# Patient Record
Sex: Male | Born: 1975 | Race: Black or African American | Hispanic: No | Marital: Single | Smoking: Current every day smoker
Health system: Southern US, Community
[De-identification: ages and names within clinical notes are randomized; demographics above are authoritative.]

## PROBLEM LIST (undated history)

## (undated) DIAGNOSIS — Z5900 Homelessness unspecified: Secondary | ICD-10-CM

## (undated) DIAGNOSIS — F1490 Cocaine use, unspecified, uncomplicated: Secondary | ICD-10-CM

## (undated) DIAGNOSIS — F1010 Alcohol abuse, uncomplicated: Secondary | ICD-10-CM

## (undated) DIAGNOSIS — S5292XA Unspecified fracture of left forearm, initial encounter for closed fracture: Secondary | ICD-10-CM

## (undated) DIAGNOSIS — F209 Schizophrenia, unspecified: Secondary | ICD-10-CM

---

## 2012-08-16 ENCOUNTER — Emergency Department (HOSPITAL_COMMUNITY)
Admission: EM | Admit: 2012-08-16 | Discharge: 2012-08-17 | Disposition: A | Discharge status: Home / Self Care | Payer: Self-pay | Attending: Emergency Medicine | Admitting: Emergency Medicine

## 2012-08-16 ENCOUNTER — Encounter (HOSPITAL_COMMUNITY): Payer: Self-pay | Admitting: *Deleted

## 2012-08-16 DIAGNOSIS — Y9389 Activity, other specified: Secondary | ICD-10-CM | POA: Insufficient documentation

## 2012-08-16 DIAGNOSIS — IMO0002 Reserved for concepts with insufficient information to code with codable children: Secondary | ICD-10-CM

## 2012-08-16 DIAGNOSIS — W2209XA Striking against other stationary object, initial encounter: Secondary | ICD-10-CM | POA: Insufficient documentation

## 2012-08-16 DIAGNOSIS — Y929 Unspecified place or not applicable: Secondary | ICD-10-CM | POA: Insufficient documentation

## 2012-08-16 DIAGNOSIS — S0180XA Unspecified open wound of other part of head, initial encounter: Secondary | ICD-10-CM | POA: Insufficient documentation
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
## 2012-08-17 NOTE — ED Provider Notes (Signed)
Medical screening examination/treatment/procedure(s) were performed by non-physician practitioner and as supervising physician I was immediately available for consultation/collaboration.    Vida Roller, MD 08/17/12 [phone]

## 2012-09-03 ENCOUNTER — Emergency Department (HOSPITAL_COMMUNITY)
Admission: EM | Admit: 2012-09-03 | Discharge: 2012-09-03 | Disposition: A | Discharge status: Home / Self Care | Payer: Self-pay | Attending: Emergency Medicine | Admitting: Emergency Medicine

## 2012-09-03 ENCOUNTER — Encounter (HOSPITAL_COMMUNITY): Payer: Self-pay | Admitting: Emergency Medicine

## 2012-09-03 DIAGNOSIS — Z4802 Encounter for removal of sutures: Secondary | ICD-10-CM | POA: Insufficient documentation

## 2012-09-03 NOTE — ED Provider Notes (Signed)
Medical screening examination/treatment/procedure(s) were performed by non-physician practitioner and as supervising physician I was immediately available for consultation/collaboration.   Titilayo Hagans, MD 09/03/12 2332 

## 2012-09-03 NOTE — ED Provider Notes (Signed)
History   This chart was scribed for non-physician practitioner working with Rolan Bucco, MD by Gerlean Ren, ED Scribe. This patient was seen in room WTR9/WTR9 and the patient's care was started at 5:52 PM.    CSN: [id number]  Arrival date & time 09/03/12  1656   First MD Initiated Contact with Patient 09/03/12 1721      No chief complaint on file.    The history is provided by the patient. No language interpreter was used.   Wesley Blair is a 37 y.o. male who presents to the Emergency Department to have 4 sutures over lateral edge of right eyebrow removed that were placed 2 weeks ago.  Pt denies any problems or pain associated with sutures.  Pt has no current physical complaints. Pt denies tobacco and alcohol use.   History reviewed. No pertinent past medical history.  History reviewed. No pertinent past surgical history.  No family history on file.  History  Substance Use Topics  . Smoking status: Never Smoker   . Smokeless tobacco: Not on file  . Alcohol Use: No      Review of Systems  Skin: Negative for wound.       Suture removal.  All other systems reviewed and are negative.    Allergies  Review of patient's allergies indicates no known allergies.  Home Medications  No current outpatient prescriptions on file.  BP 145/80  Pulse 90  Temp 98.7 F (37.1 C) (Oral)  Resp 20  Wt 170 lb (77.111 kg)  SpO2 100%  Physical Exam  Nursing note and vitals reviewed. Constitutional: He is oriented to person, place, and time. He appears well-developed and well-nourished. No distress.  HENT:  Head: Normocephalic and atraumatic.       Wound to right eyebrow looks well-healing without signs of infection, sutures intact x4.   Eyes: EOM are normal.  Neck: Neck supple. No tracheal deviation present.  Cardiovascular: Normal rate.   Pulmonary/Chest: Effort normal. No respiratory distress.  Musculoskeletal: Normal range of motion.  Neurological: He is alert and oriented  to person, place, and time.  Skin: Skin is warm and dry.  Psychiatric: He has a normal mood and affect. His behavior is normal.    ED Course  Procedures (including critical care time) DIAGNOSTIC STUDIES: Oxygen Saturation is 100% on room air, normal by my interpretation.    COORDINATION OF CARE: 5:54 PM- Patient informed of clinical course, understands medical decision-making process, and agrees with plan.  Labs Reviewed - No data to display No results found.   1. Visit for suture removal       MDM  4 sutures removed without complication.  I personally performed the services described in this documentation, which was scribed in my presence. The recorded information has been reviewed and is accurate.         Dorthula Matas, PA 09/03/12 1806

## 2012-09-03 NOTE — ED Notes (Signed)
Pt here for removal of sutures on right eyebrow.  Area is free from drainage, and healed.

## 2012-09-07 ENCOUNTER — Emergency Department (HOSPITAL_COMMUNITY)
Admission: EM | Admit: 2012-09-07 | Discharge: 2012-09-07 | Disposition: A | Discharge status: Home / Self Care | Payer: Self-pay | Attending: Emergency Medicine | Admitting: Emergency Medicine

## 2012-09-07 ENCOUNTER — Encounter (HOSPITAL_COMMUNITY): Payer: Self-pay | Admitting: *Deleted

## 2012-09-07 DIAGNOSIS — IMO0002 Reserved for concepts with insufficient information to code with codable children: Secondary | ICD-10-CM | POA: Insufficient documentation

## 2012-09-07 DIAGNOSIS — R220 Localized swelling, mass and lump, head: Secondary | ICD-10-CM

## 2012-09-07 DIAGNOSIS — X58XXXA Exposure to other specified factors, initial encounter: Secondary | ICD-10-CM | POA: Insufficient documentation

## 2012-09-07 DIAGNOSIS — Y929 Unspecified place or not applicable: Secondary | ICD-10-CM | POA: Insufficient documentation

## 2012-09-07 DIAGNOSIS — Y939 Activity, unspecified: Secondary | ICD-10-CM | POA: Insufficient documentation

## 2012-09-07 DIAGNOSIS — S0081XA Abrasion of other part of head, initial encounter: Secondary | ICD-10-CM

## 2012-09-07 MED ORDER — MUPIROCIN CALCIUM 2 % EX CREA: TOPICAL_CREAM | Freq: Three times a day (TID) | CUTANEOUS | Status: DC

## 2012-09-07 NOTE — ED Provider Notes (Signed)
History    .This chart was scribed for Wesley Kras, MD by Marlin Canary. The patient was seen in room WTR9/WTR9. Patient's care was started at 1459.  CSN: [id number]  Arrival date & time 09/07/12  1459   First MD Initiated Contact with Patient 09/07/12 1622      Chief Complaint  Patient presents with  . Facial Swelling    (Consider location/radiation/quality/duration/timing/severity/associated sxs/prior treatment) The history is provided by the patient. No language interpreter was used.   Robb Sibal is a 37 y.o. male who presents to the Emergency Department complaining of moderate facial swelling on his forehead and nose onset 2 days ago. States had his bandanna being tied too tight which cut into his face and left an abrasion. States had swelling around his forehead and abrasion. The swelling  over the forehead migrated down to his nose today. Patient denies fever, chills or use of new lotions. He reports no other symptoms. No swelling of lips, tongue, throat. No rash.    History reviewed. No pertinent past medical history.  History reviewed. No pertinent past surgical history.  History reviewed. No pertinent family history.  History  Substance Use Topics  . Smoking status: Never Smoker   . Smokeless tobacco: Not on file  . Alcohol Use: No      Review of Systems  Constitutional: Negative.   HENT: Positive for facial swelling. Negative for ear pain, nosebleeds, congestion, neck pain, neck stiffness and ear discharge.   Eyes: Negative.   Respiratory: Negative.   Cardiovascular: Negative.   Musculoskeletal: Negative.   Skin: Positive for wound.  Neurological: Negative.  Negative for headaches.  All other systems reviewed and are negative.   A complete 10 system review of systems was obtained and all systems are negative except as noted in the HPI and PMH.   Allergies  Review of patient's allergies indicates no known allergies.  Home Medications  No current  outpatient prescriptions on file.  BP 130/77  Pulse 81  Temp 98.7 F (37.1 C) (Oral)  Resp 16  SpO2 97%  Physical Exam  Nursing note and vitals reviewed. Constitutional: He is oriented to person, place, and time. He appears well-developed.  HENT:  Right Ear: External ear normal.  Left Ear: External ear normal.       1x1 cm abrasion to forehead, swelling surrounding the abrasion extending from the forehead to the bridge of the nose.  No redness or tenderness upon palpation of the forehead No drainage of the abrasion     Eyes: Pupils are equal, round, and reactive to light.  Neck: Neck supple.  Cardiovascular: Normal rate, regular rhythm and normal heart sounds.  Exam reveals no gallop and no friction rub.   No murmur heard. Pulmonary/Chest: Breath sounds normal. No respiratory distress. He has no wheezes. He has no rales. He exhibits no tenderness.  Musculoskeletal: He exhibits no edema.  Neurological: He is alert and oriented to person, place, and time.  Skin: Skin is warm and dry.  Psychiatric: He has a normal mood and affect. His behavior is normal.    ED Course  Procedures (including critical care time)   DIAGNOSTIC STUDIES: Oxygen Saturation is 98.7% on room air, Normal by my interpretation.    COORDINATION OF CARE:  1630-Patient / Family / Caregiver informed of clinical course, understand medical decision-making process, and agree with plan.     Labs Reviewed - No data to display No results found.   1. Abrasion of forehead  2. Facial swelling       MDM  PT with some swelling to the bridge of the nose and an abrasion to right forehead. No tenderness, no signs of cellulitis. Do not think this is an allergic reaction. Suspect edema from bandana being on too tight. Will treat abrasion with topical antibiotic ointment. Follow up as needed.    I personally performed the services described in this documentation, which was scribed in my presence. The recorded  information has been reviewed and is accurate.      Lottie Mussel, PA 09/08/12 [phone]

## 2012-09-07 NOTE — ED Notes (Signed)
Pt reports "tying hair too tight." reports swelling to top of forehead and above nose.

## 2012-09-09 NOTE — ED Provider Notes (Signed)
Medical screening examination/treatment/procedure(s) were performed by non-physician practitioner and as supervising physician I was immediately available for consultation/collaboration.    Keevon Henney R Andres Escandon, MD 09/09/12 1508 

## 2013-01-06 ENCOUNTER — Emergency Department (HOSPITAL_COMMUNITY)
Admission: EM | Admit: 2013-01-06 | Discharge: 2013-01-06 | Disposition: A | Discharge status: Home / Self Care | Payer: Self-pay | Attending: Emergency Medicine | Admitting: Emergency Medicine

## 2013-01-06 DIAGNOSIS — L299 Pruritus, unspecified: Secondary | ICD-10-CM | POA: Insufficient documentation

## 2013-01-06 DIAGNOSIS — R21 Rash and other nonspecific skin eruption: Secondary | ICD-10-CM | POA: Insufficient documentation

## 2013-01-06 NOTE — ED Provider Notes (Signed)
History    This chart was scribed for Jaynie Crumble, PA working with Raeford Razor, MD by ED Scribe, Burman Nieves. This patient was seen in room WTR7/WTR7 and the patient's care was started at 4:59 PM.   CSN: [id number]  Arrival date & time 01/06/13  1549   First MD Initiated Contact with Patient 01/06/13 1659      No chief complaint on file.   (Consider location/radiation/quality/duration/timing/severity/associated sxs/prior treatment) The history is provided by the patient. No language interpreter was used.   HPI Comments: Wesley Blair is a 37 y.o. male who presents to the Emergency Department complaining of moderate constant irritation due to rash's on his left forearm and right lower leg below knee onset for a couple months now. Pt currently has no evident rash's on body in the ED. Pt states that when he takes showers the rash becomes irritated and starts to itch. Pt states that the rash is red raised and feels like something is crawling around in his skin. He reports that he scratched affected areas so much that he is missing hair on his left arm and right leg.  Pt denies using any new lotions or body care products. Pt denies any trouble breathing/swallowing,  fever, chills, cough, nausea, vomiting, diarrhea, SOB, weakness, and any other associated symptoms.]    No past medical history on file.  No past surgical history on file.  No family history on file.  History  Substance Use Topics  . Smoking status: Never Smoker   . Smokeless tobacco: Not on file  . Alcohol Use: No      Review of Systems  Skin: Positive for rash.    Allergies  Review of patient's allergies indicates no known allergies.  Home Medications  No current outpatient prescriptions on file.  BP 143/87  Pulse 96  Temp(Src) 97.8 F (36.6 C) (Oral)  Resp 16  SpO2 100%  Physical Exam  Nursing note and vitals reviewed. Constitutional: He is oriented to person, place, and time. He appears  well-developed and well-nourished. No distress.  HENT:  Head: Normocephalic and atraumatic.  Eyes: Conjunctivae and EOM are normal. Pupils are equal, round, and reactive to light.  Neck: Normal range of motion. Neck supple. No tracheal deviation present.  Cardiovascular: Normal rate, regular rhythm and normal heart sounds.   Pulmonary/Chest: Effort normal and breath sounds normal. No respiratory distress.  Musculoskeletal: Normal range of motion.  Neurological: He is alert and oriented to person, place, and time.  Skin: Skin is warm and dry. No rash noted.  No rash's or excoriations evident during PE.   Psychiatric:  Flat affect    ED Course  Procedures (including critical care time) DIAGNOSTIC STUDIES: Oxygen Saturation is 100% on room air, normal by my interpretation.    COORDINATION OF CARE: 5:14 PM Pt disagreed with discussion and left ED before discharged.     Labs Reviewed - No data to display No results found.   1. Itching       MDM  Pt with itching at home, only after showering for several months. States gets "welps" that itch on arms and legs. States he is concerned that he may have under skin parasite or bacteria. Pt requesting treatment. Pt currently has no symptoms. No rash or itching. Skin appears normal. He is afebrile. He does have flat affect but denies any psychiatric disorders. I suspect his rash is either hypersensetivity to warm water or soap he is using or psychosomatic. Instructed to start benadryl. Lower  the temp of his water in the shower, follow up with his doctor.    I personally performed the services described in this documentation, which was scribed in my presence. The recorded information has been reviewed and is accurate.      Lottie Mussel, PA-C 01/06/13 1725

## 2013-01-07 NOTE — ED Provider Notes (Signed)
Medical screening examination/treatment/procedure(s) were performed by non-physician practitioner and as supervising physician I was immediately available for consultation/collaboration.  Tavonte Seybold, MD 01/07/13 0115 

## 2013-04-26 ENCOUNTER — Encounter (HOSPITAL_COMMUNITY): Payer: Self-pay

## 2013-04-26 ENCOUNTER — Emergency Department (INDEPENDENT_AMBULATORY_CARE_PROVIDER_SITE_OTHER)
Admission: EM | Admit: 2013-04-26 | Discharge: 2013-04-26 | Disposition: A | Discharge status: Home / Self Care | Payer: Self-pay | Source: Home / Self Care | Attending: Emergency Medicine | Admitting: Emergency Medicine

## 2013-04-26 DIAGNOSIS — M25519 Pain in unspecified shoulder: Secondary | ICD-10-CM

## 2013-04-26 DIAGNOSIS — M25511 Pain in right shoulder: Secondary | ICD-10-CM

## 2013-04-26 MED ORDER — MELOXICAM 7.5 MG PO TABS: 7.5000 mg | ORAL_TABLET | Freq: Every day | ORAL | Status: DC

## 2013-04-26 MED ORDER — CYCLOBENZAPRINE HCL 10 MG PO TABS: 10.0000 mg | ORAL_TABLET | Freq: Two times a day (BID) | ORAL | Status: DC | PRN

## 2013-04-26 NOTE — ED Provider Notes (Signed)
CSN: [id number]     Arrival date & time 04/26/13  1207 History     First MD Initiated Contact with Patient 04/26/13 1229     Chief Complaint  Patient presents with  . Shoulder Pain   (Consider location/radiation/quality/duration/timing/severity/associated sxs/prior Treatment) HPI Comments: Patient presents urgent care complaining of right shoulder pain he describes pain at times feels like shooting and stabbing on top of his right shoulder he denies any recent injury or trauma or falls. The patient comes and goes he has not taken any over the counter medicines. Describes pain is worse when he moves it shoulder area. Patient carries a backpack with him at all times. Denies any weakness of his right upper extremity or numbness or tingling sensations.  Patient is a 37 y.o. male presenting with shoulder pain. The history is provided by the patient.  Shoulder Pain This is a new problem. The problem occurs constantly. The problem has been gradually worsening. Pertinent negatives include no chest pain, no abdominal pain, no headaches and no shortness of breath. Nothing aggravates the symptoms. Nothing relieves the symptoms. He has tried nothing for the symptoms. The treatment provided no relief.    History reviewed. No pertinent past medical history. History reviewed. No pertinent past surgical history. History reviewed. No pertinent family history. History  Substance Use Topics  . Smoking status: Never Smoker   . Smokeless tobacco: Not on file  . Alcohol Use: No    Review of Systems  Constitutional: Positive for activity change and appetite change. Negative for fever and fatigue.  HENT: Negative for neck pain and neck stiffness.   Respiratory: Negative for shortness of breath.   Cardiovascular: Negative for chest pain.  Gastrointestinal: Negative for abdominal pain.  Musculoskeletal: Negative for myalgias, back pain, joint swelling, arthralgias and gait problem.  Skin: Negative for  color change, pallor, rash and wound.  Neurological: Negative for weakness and headaches.    Allergies  Review of patient's allergies indicates no known allergies.  Home Medications   Current Outpatient Rx  Name  Route  Sig  Dispense  Refill  . cyclobenzaprine (FLEXERIL) 10 MG tablet   Oral   Take 1 tablet (10 mg total) by mouth 2 (two) times daily as needed for muscle spasms.   20 tablet   0   . meloxicam (MOBIC) 7.5 MG tablet   Oral   Take 1 tablet (7.5 mg total) by mouth daily.   14 tablet   0    BP 127/83  Pulse 72  Temp(Src) 98.3 F (36.8 C) (Oral)  Resp 12  SpO2 100% Physical Exam  Nursing note and vitals reviewed. Constitutional: Vital signs are normal. He appears well-developed and well-nourished.  Non-toxic appearance. He does not have a sickly appearance. He does not appear ill. No distress.  Pulmonary/Chest: Effort normal and breath sounds normal.  Musculoskeletal: He exhibits tenderness.       Right shoulder: He exhibits tenderness and pain. He exhibits normal range of motion, no bony tenderness, no swelling, no effusion, no crepitus, no deformity, no laceration, no spasm, normal pulse and normal strength.       Arms: Neurological: He is alert.  Skin: No rash noted. No erythema.    ED Course   Procedures (including critical care time)  Labs Reviewed - No data to display No results found. 1. Shoulder pain, acute, right     MDM  Right shoulder pain ( seemed to originate more from his Trapezium) Patient instructed to take meloxicam  and a muscle relaxer for 10-14 days and to followup with orthopedic Dr. if no improvement is noted patient carries with him a heavy backpack but probably weights more than 15 or 20 pounds.   Patient had multiple questions about rashes, pruritus and STD exposure but with no symptoms or current rash is a can be evaluated I haven't otherwise patient to establish her primary care Dr. or to return when he gets to see a rash that  we can diagnose. Patient has also been refer to go to the STD clinic at [facility] department for a CT screening in an asymptomatic individual.  There are no discharge medications for this patient.   Jimmie Molly, MD 04/26/13 [phone]

## 2013-04-26 NOTE — ED Notes (Signed)
Reports 2 weeks shooting & stabbing  pain in right shoulder, but denies radiation of pain. Waxes and wanes, but never completely relieved x 2 weeks; had not used any OTC medications; NAD

## 2013-10-29 ENCOUNTER — Emergency Department (HOSPITAL_COMMUNITY)
Admission: EM | Admit: 2013-10-29 | Discharge: 2013-10-29 | Discharge status: Against Medical Advice | Payer: Self-pay | Attending: Emergency Medicine | Admitting: Emergency Medicine

## 2013-10-29 ENCOUNTER — Encounter (HOSPITAL_COMMUNITY): Payer: Self-pay | Admitting: Emergency Medicine

## 2013-10-29 DIAGNOSIS — Z23 Encounter for immunization: Secondary | ICD-10-CM | POA: Insufficient documentation

## 2013-10-29 DIAGNOSIS — L03319 Cellulitis of trunk, unspecified: Principal | ICD-10-CM

## 2013-10-29 DIAGNOSIS — L02219 Cutaneous abscess of trunk, unspecified: Secondary | ICD-10-CM | POA: Insufficient documentation

## 2013-10-29 DIAGNOSIS — Z791 Long term (current) use of non-steroidal anti-inflammatories (NSAID): Secondary | ICD-10-CM | POA: Insufficient documentation

## 2013-10-29 DIAGNOSIS — L02212 Cutaneous abscess of back [any part, except buttock]: Secondary | ICD-10-CM

## 2013-10-29 MED ORDER — TETANUS-DIPHTH-ACELL PERTUSSIS 5-2.5-18.5 LF-MCG/0.5 IM SUSP: 0.5000 mL | Freq: Once | INTRAMUSCULAR | Status: DC

## 2013-10-29 NOTE — ED Notes (Signed)
Pt has a central, mid-back abscess. The abscess began two months ago, however didn't begin to grow in size till three days ago. Pt reports pain at the site. Pt is A/O x4, in NAD, and vitals are WDL.

## 2013-10-29 NOTE — ED Notes (Signed)
PA went in to see pt and he was not in room.

## 2013-10-29 NOTE — ED Provider Notes (Signed)
CSN: [id number]     Arrival date & time 10/29/13  1652 History  This chart was scribed for non-physician practitioner, Vernie Murders, PA-C,working with Kathalene Frames, MD, by Marlowe Kays, ED Scribe.  This patient was seen in room WTR6/WTR6 and the patient's care was started at 7:26 PM.  Chief Complaint  Patient presents with  . Abscess   The history is provided by the patient. No language interpreter was used.   HPI Comments:  Wesley Blair is a 38 y.o. male who presents to the Emergency Department complaining of an abscess in the middle of his back that started approximately two months ago. Pt states it recently started growing larger and is now becoming painful. He states he has had one other abscess before on his right hip after a bone marrow biopsy. He states he is unsure if his current abscess has been draining. Denies any previous wounds or abrasions. He denies fever, back pain, numbness/tingling, loss of bowel/bladder function, loss of sensation, weakness, or any other issues. He denies h/o DM or any other chronic illnesses. He denies IV drug use. He states his last tetanus vaccination was in 2006.   History reviewed. No pertinent past medical history. History reviewed. No pertinent past surgical history. No family history on file. History  Substance Use Topics  . Smoking status: Never Smoker   . Smokeless tobacco: Never Used  . Alcohol Use: Yes     Comment: Socailly     Review of Systems  Constitutional: Negative for fever, chills, diaphoresis, activity change, appetite change and fatigue.  Gastrointestinal: Negative for nausea, vomiting and abdominal pain.  Genitourinary: Negative for difficulty urinating.  Musculoskeletal: Negative for back pain, gait problem and neck pain.  Skin:       Abscess to center, mid back   Neurological: Negative for dizziness, weakness, light-headedness, numbness and headaches.  All other systems reviewed and are negative.    Allergies   Review of patient's allergies indicates no known allergies.  Home Medications   Current Outpatient Rx  Name  Route  Sig  Dispense  Refill  . cyclobenzaprine (FLEXERIL) 10 MG tablet   Oral   Take 1 tablet (10 mg total) by mouth 2 (two) times daily as needed for muscle spasms.   20 tablet   0   . meloxicam (MOBIC) 7.5 MG tablet   Oral   Take 1 tablet (7.5 mg total) by mouth daily.   14 tablet   0    Triage Vitals: BP 138/92  Pulse 90  Temp(Src) 98.8 F (37.1 C) (Oral)  Resp 18  SpO2 97%  Filed Vitals:   10/29/13 1715  BP: 138/92  Pulse: 90  Temp: 98.8 F (37.1 C)  TempSrc: Oral  Resp: 18  SpO2: 97%    Physical Exam  Nursing note and vitals reviewed. Constitutional: He is oriented to person, place, and time. He appears well-developed and well-nourished. No distress.  HENT:  Head: Normocephalic and atraumatic.  Right Ear: External ear normal.  Left Ear: External ear normal.  Mouth/Throat: Oropharynx is clear and moist.  Eyes: Conjunctivae are normal. Right eye exhibits no discharge. Left eye exhibits no discharge.  Neck: Normal range of motion. Neck supple.  Cardiovascular: Normal rate, regular rhythm and normal heart sounds.  Exam reveals no gallop and no friction rub.   No murmur heard. Pulmonary/Chest: Effort normal and breath sounds normal. No respiratory distress. He has no wheezes. He has no rales.   He exhibits no tenderness.  Abdominal: Soft. He exhibits no distension. There is no tenderness.  Musculoskeletal: Normal range of motion. He exhibits no edema and no tenderness.  Strength 5/5 in the upper and lower extremities bilaterally. Patient able to ambulate without difficulty or ataxia  Neurological: He is alert and oriented to person, place, and time.  Sensation intact in the UE and LE bilaterally  Skin: Skin is warm and dry. He is not diaphoretic.  2 cm x 2 cm fluctuant, tender, erythematous abscess to the middle thoracic back approximately 1 cm  lateral to the spinous processes on the left. No open wounds or evidence of drainage. No surrounding erythema or edema. No tenderness to palpation to the thoracic or lumbar spinous processes throughout.  No tenderness to palpation to the paraspinal muscles throughout.     ED Course  Procedures (including critical care time) DIAGNOSTIC STUDIES: Oxygen Saturation is 97% on RA, normal by my interpretation.   COORDINATION OF CARE: 7:29 PM- Will update tetanus vaccination and I & D abscess. Pt verbalizes understanding and agrees to plan.  Medications  Tdap (BOOSTRIX) injection 0.5 mL (not administered)    Labs Review Labs Reviewed - No data to display Imaging Review No results found.  EKG Interpretation  None  MDM   Wesley Blair is a 38 y.o. male who presents to the Emergency Department complaining of an abscess in the middle of his back that started approximately two months ago. Ordered tetanus booster. Went back to I&D the abscess and the patient had eloped. Location of abscess in close approximation to the spinous process, however, it appears superficial in nature. Doubt epidural abscess. Patient afebrile and non-toxic in appearance. No complaints of back pain. Patient neurovascularly intact with no focal neurological deficits. Patient staffed with Dr. Tomi Bamberger however patient left before he could examine the patient.     Final impressions: 1. Abscess of back      Mercy Moore PA-C    I personally performed the services described in this documentation, which was scribed in my presence. The recorded information has been reviewed and is accurate.    Lucila Maine, PA-C 10/30/13 [phone]

## 2013-10-30 NOTE — ED Provider Notes (Signed)
Medical screening examination/treatment/procedure(s) were performed by non-physician practitioner and as supervising physician I was immediately available for consultation/collaboration.   Celene KrasJon R Elex Mainwaring, MD 10/30/13 [phone]

## 2013-11-02 ENCOUNTER — Emergency Department (HOSPITAL_COMMUNITY): Admission: EM | Admit: 2013-11-02 | Discharge: 2013-11-02 | Discharge status: Against Medical Advice | Payer: Self-pay

## 2013-11-02 NOTE — ED Notes (Signed)
Pt called for 3rd time with no response.  

## 2013-11-02 NOTE — ED Notes (Signed)
Called pt 2x from lobby with no response

## 2013-11-04 ENCOUNTER — Encounter (HOSPITAL_COMMUNITY): Payer: Self-pay | Admitting: Emergency Medicine

## 2013-11-04 ENCOUNTER — Emergency Department (HOSPITAL_COMMUNITY)
Admission: EM | Admit: 2013-11-04 | Discharge: 2013-11-04 | Disposition: A | Discharge status: Home / Self Care | Payer: Self-pay | Attending: Emergency Medicine | Admitting: Emergency Medicine

## 2013-11-04 DIAGNOSIS — L03319 Cellulitis of trunk, unspecified: Principal | ICD-10-CM

## 2013-11-04 DIAGNOSIS — L02219 Cutaneous abscess of trunk, unspecified: Secondary | ICD-10-CM | POA: Insufficient documentation

## 2013-11-04 DIAGNOSIS — L0291 Cutaneous abscess, unspecified: Secondary | ICD-10-CM

## 2013-11-04 MED ORDER — HYDROCODONE-ACETAMINOPHEN 5-325 MG PO TABS: 2.0000 | ORAL_TABLET | Freq: Four times a day (QID) | ORAL | Status: DC | PRN

## 2013-11-04 MED ORDER — IBUPROFEN 800 MG PO TABS
800.0000 mg | ORAL_TABLET | Freq: Once | ORAL | Status: AC
  Administered 2013-11-04: 800 mg via ORAL
  Filled 2013-11-04: qty 1

## 2013-11-04 MED ORDER — SULFAMETHOXAZOLE-TRIMETHOPRIM 800-160 MG PO TABS: 1.0000 | ORAL_TABLET | Freq: Two times a day (BID) | ORAL | Status: AC

## 2013-11-04 NOTE — ED Notes (Signed)
Pt has abscess on his back for 2 months.  Last 3 weeks it has gotten worse.

## 2013-11-04 NOTE — Discharge Instructions (Signed)
Abscess An abscess is an infected area that contains a collection of pus and debris.It can occur in almost any part of the body. An abscess is also known as a furuncle or boil. CAUSES  An abscess occurs when tissue gets infected. This can occur from blockage of oil or sweat glands, infection of hair follicles, or a minor injury to the skin. As the body tries to fight the infection, pus collects in the area and creates pressure under the skin. This pressure causes pain. People with weakened immune systems have difficulty fighting infections and get certain abscesses more often.  SYMPTOMS Usually an abscess develops on the skin and becomes a painful mass that is red, warm, and tender. If the abscess forms under the skin, you may feel a moveable soft area under the skin. Some abscesses break open (rupture) on their own, but most will continue to get worse without care. The infection can spread deeper into the body and eventually into the bloodstream, causing you to feel ill.  DIAGNOSIS  Your caregiver will take your medical history and perform a physical exam. A sample of fluid may also be taken from the abscess to determine what is causing your infection. TREATMENT  Your caregiver may prescribe antibiotic medicines to fight the infection. However, taking antibiotics alone usually does not cure an abscess. Your caregiver may need to make a small cut (incision) in the abscess to drain the pus. In some cases, gauze is packed into the abscess to reduce pain and to continue draining the area. HOME CARE INSTRUCTIONS   Only take over-the-counter or prescription medicines for pain, discomfort, or fever as directed by your caregiver.  If you were prescribed antibiotics, take them as directed. Finish them even if you start to feel better.  If gauze is used, follow your caregiver's directions for changing the gauze.  To avoid spreading the infection:  Keep your draining abscess covered with a  bandage.  Wash your hands well.  Do not share personal care items, towels, or whirlpools with others.  Avoid skin contact with others.  Keep your skin and clothes clean around the abscess.  Keep all follow-up appointments as directed by your caregiver. SEEK MEDICAL CARE IF:   You have increased pain, swelling, redness, fluid drainage, or bleeding.  You have muscle aches, chills, or a general ill feeling.  You have a fever. MAKE SURE YOU:   Understand these instructions.  Will watch your condition.  Will get help right away if you are not doing well or get worse. Document Released: 05/29/2005 Document Revised: 02/18/2012 Document Reviewed: 11/01/2011 [facility] Patient Information 2014 [place], [place].   Take antibiotic as prescribed Take ibuprofen for pain or discomfort Return for wound check in 2 days Return if worsening pain, redness or swelling

## 2013-11-04 NOTE — Progress Notes (Signed)
[facility] provided pt with a list of primary care resources to help patient establish primary care.  °

## 2013-11-04 NOTE — ED Provider Notes (Signed)
CSN: [id number]     Arrival date & time 11/04/13  1216 History  This chart was scribed for non-physician practitioner, Irish EldersKelly Fong Mccarry, NP,  Ward GivensIva L Knapp, MD, by Yevette EdwardsAngela Bracken, ED Scribe. This patient was seen in room WTR6/WTR6 and the patient's care was started at 3:04 PM.   First MD Initiated Contact with Patient 11/04/13 1458     Chief Complaint  Patient presents with  . Abscess    The history is provided by the patient. No language interpreter was used.   HPI Comments: Wesley Blair is a 38 y.o. male who presents to the Emergency Department complaining of an abscess to his middle, thoracic back which has been present for two months, but which has increased in size  in the past three weeks. The pt rates the pain associated with the abscess is 9/10.  He denies a fever; in the ED his temperature is 99 F. The pt has not treated the abscess with any medication. He denies a h/o of abscesses or boils. He also denies a h/o DM.   History reviewed. No pertinent past medical history. History reviewed. No pertinent past surgical history. History reviewed. No pertinent family history. History  Substance Use Topics  . Smoking status: Never Smoker   . Smokeless tobacco: Never Used  . Alcohol Use: Yes     Comment: Socailly     Review of Systems  Constitutional: Negative for fever.  Skin: Positive for color change and wound.  All other systems reviewed and are negative.   Allergies  Review of patient's allergies indicates no known allergies.  Home Medications  No current outpatient prescriptions on file.  Triage Vitals: BP 137/95  Pulse 87  Temp(Src) 99 F (37.2 C) (Oral)  Resp 16  SpO2 98%  Physical Exam  Nursing note and vitals reviewed. Constitutional: He is oriented to person, place, and time. He appears well-developed and well-nourished. No distress.  HENT:  Head: Normocephalic and atraumatic.  Eyes: EOM are normal. Right eye exhibits no discharge. Left eye exhibits no  discharge.  Neck: Neck supple. No tracheal deviation present.  Cardiovascular: Normal rate and regular rhythm.  Exam reveals no friction rub.   No murmur heard. Pulmonary/Chest: Effort normal and breath sounds normal. No respiratory distress. He has no wheezes.  Musculoskeletal: Normal range of motion.  Neurological: He is alert and oriented to person, place, and time.  Skin: Skin is warm and dry. There is erythema (Localized to abscess).  3-4 cm abscess to middle thoracic back. Edema and erythema localized to site. No streaking.   Psychiatric: He has a normal mood and affect. His behavior is normal.    ED Course  Procedures (including critical care time)  DIAGNOSTIC STUDIES: Oxygen Saturation is 98% on room air, normal by my interpretation.    COORDINATION OF CARE:  3:07 PM- Discussed treatment plan with patient, which includes oral pain medication, an incision and drainage, and antibiotics, and the patient agreed to the plan.   3:16 PM- INCISION AND DRAINAGE PROCEDURE NOTE: Patient identification was confirmed and verbal consent was obtained. This procedure was performed by  Irish EldersKelly Mena Lienau, NP, at 3:16 PM. Site: 3-4 cm abscess to middle of thoracic back Sterile procedures observed Needle size: 23 Anesthetic used (type and amt): 2% lido without epi; 7 ml Blade size: 15 Drainage: large amount Complexity: Complex Packing used Site anesthetized, incision made over site, wound drained and explored loculations, rinsed with copious amounts of normal saline, wound packed with sterile gauze, covered  with dry, sterile dressing.  Pt tolerated procedure well without complications.  Instructions for care discussed verbally and pt provided with additional written instructions for homecare and f/u.  Labs Review Labs Reviewed - No data to display Imaging Review No results found.   EKG Interpretation None      MDM   Final diagnoses:  Abscess    Abscess I&D without  complications. Advised return in 2 days for wound check. Discussed plan of care and wound care, pt agrees. Prescriptions for Bactrim DS and hydrocodone given.   I personally performed the services described in this documentation, which was scribed in my presence. The recorded information has been reviewed and is accurate.    Irish Elders, NP 11/23/13 2244

## 2013-11-07 ENCOUNTER — Encounter (HOSPITAL_COMMUNITY): Payer: Self-pay | Admitting: Emergency Medicine

## 2013-11-07 ENCOUNTER — Emergency Department (HOSPITAL_COMMUNITY)
Admission: EM | Admit: 2013-11-07 | Discharge: 2013-11-08 | Discharge status: Against Medical Advice | Payer: Self-pay | Attending: Emergency Medicine | Admitting: Emergency Medicine

## 2013-11-07 DIAGNOSIS — L02212 Cutaneous abscess of back [any part, except buttock]: Secondary | ICD-10-CM

## 2013-11-07 DIAGNOSIS — L03319 Cellulitis of trunk, unspecified: Principal | ICD-10-CM

## 2013-11-07 DIAGNOSIS — L02219 Cutaneous abscess of trunk, unspecified: Secondary | ICD-10-CM | POA: Insufficient documentation

## 2013-11-07 DIAGNOSIS — Z792 Long term (current) use of antibiotics: Secondary | ICD-10-CM | POA: Insufficient documentation

## 2013-11-07 NOTE — ED Provider Notes (Signed)
CSN: [id number]     Arrival date & time 11/07/13  2326 History   This chart was scribed for non-physician practitioner Arman FilterGail K Virgilio Broadhead, NP, working with Olivia Mackielga M Otter, MD, by Yevette EdwardsAngela Bracken, ED Scribe. This patient was seen in room WTR7/WTR7 and the patient's care was started at 11:49 PM.  First MD Initiated Contact with Patient 11/07/13 2345     Chief Complaint  Patient presents with  . Abscess    The history is provided by the patient. No language interpreter was used.   HPI Comments: Beckie SaltsZurich Unrein is a 38 y.o. male who presents to the Emergency Department for a follow-up to an abscess the pt had incised and drained three days ago. The pt has been taking the antibiotics prescribed to him. The site is not currently draining.   History reviewed. No pertinent past medical history. History reviewed. No pertinent past surgical history. No family history on file. History  Substance Use Topics  . Smoking status: Never Smoker   . Smokeless tobacco: Never Used  . Alcohol Use: Yes     Comment: Socailly     Review of Systems  Skin: Positive for wound.    Allergies  Review of patient's allergies indicates no known allergies.  Home Medications   Current Outpatient Rx  Name  Route  Sig  Dispense  Refill  . HYDROcodone-acetaminophen (NORCO/VICODIN) 5-325 MG per tablet   Oral   Take 2 tablets by mouth every 6 (six) hours as needed.   6 tablet   0   . sulfamethoxazole-trimethoprim (BACTRIM DS,SEPTRA DS) 800-160 MG per tablet   Oral   Take 1 tablet by mouth 2 (two) times daily.   14 tablet   0    Triage Vitals: BP 126/85  Pulse 99  Temp(Src) 99.9 F (37.7 C) (Oral)  Resp 18  SpO2 98%  Physical Exam  Nursing note and vitals reviewed. Constitutional: He is oriented to person, place, and time. He appears well-developed and well-nourished. No distress.  HENT:  Head: Normocephalic and atraumatic.  Eyes: EOM are normal.  Neck: Neck supple. No tracheal deviation present.   Cardiovascular: Normal rate.   Pulmonary/Chest: Effort normal. No respiratory distress. He exhibits no tenderness.  Musculoskeletal: Normal range of motion.  Neurological: He is alert and oriented to person, place, and time.  Skin: Skin is warm and dry.  Abscess site shows slight erythema in the surrounding margins.  No pertinent drainage.   Psychiatric: He has a normal mood and affect. His behavior is normal.    ED Course  Procedures (including critical care time)  DIAGNOSTIC STUDIES: Oxygen Saturation is 98% on room air, normal by my interpretation.    COORDINATION OF CARE:  11:53 PM- Discussed treatment plan with patient, which includes a new dressing, and the patient agreed to the plan. Reminded the pt to finish the course of antibiotics.   Labs Review Labs Reviewed - No data to display Imaging Review No results found.   EKG Interpretation None     Healing abscess.  Will allow skin to heal.  Dressing will be placed.  Patient is urged to continue his antibiotic Patient left after dressing, change that waiting for his discharge papers MDM   Final diagnoses:  Abscess of back      I personally performed the services described in this documentation, which was scribed in my presence. The recorded information has been reviewed and is accurate.   Arman FilterGail K Camary Sosa, NP 11/09/13 [phone]

## 2013-11-07 NOTE — ED Notes (Signed)
Pt has abscess to upper L back area, had drained 2 days ago, was told to return here for recheck.

## 2013-11-08 NOTE — ED Notes (Signed)
Pt states "I need to leave, I'm good right", pt explained he needs to wait for discharge paperwork, stated he needed to leave now, pt walked out.

## 2013-11-08 NOTE — ED Notes (Signed)
drsg applied over abscess on back.

## 2013-11-09 NOTE — ED Provider Notes (Signed)
Medical screening examination/treatment/procedure(s) were performed by non-physician practitioner and as supervising physician I was immediately available for consultation/collaboration.   EKG Interpretation None       Olivia Mackielga M Zaion Hreha, MD 11/09/13 1146

## 2013-11-24 NOTE — ED Provider Notes (Signed)
Medical screening examination/treatment/procedure(s) were performed by non-physician practitioner and as supervising physician I was immediately available for consultation/collaboration.   EKG Interpretation None      Devoria AlbeIva Nariyah Osias, MD, Armando GangFACEP   Ward GivensIva L Stephine Langbehn, MD 11/24/13 [phone]

## 2016-02-14 ENCOUNTER — Encounter (HOSPITAL_COMMUNITY): Payer: Self-pay | Admitting: Emergency Medicine

## 2016-02-14 DIAGNOSIS — M6282 Rhabdomyolysis: Secondary | ICD-10-CM | POA: Insufficient documentation

## 2016-02-14 DIAGNOSIS — N179 Acute kidney failure, unspecified: Secondary | ICD-10-CM | POA: Diagnosis present

## 2016-02-14 DIAGNOSIS — F17200 Nicotine dependence, unspecified, uncomplicated: Secondary | ICD-10-CM | POA: Diagnosis not present

## 2016-02-14 DIAGNOSIS — E860 Dehydration: Secondary | ICD-10-CM | POA: Insufficient documentation

## 2016-02-14 DIAGNOSIS — F1490 Cocaine use, unspecified, uncomplicated: Secondary | ICD-10-CM | POA: Insufficient documentation

## 2016-02-14 DIAGNOSIS — E871 Hypo-osmolality and hyponatremia: Secondary | ICD-10-CM | POA: Insufficient documentation

## 2016-02-14 LAB — I-STAT TROPONIN, ED: Troponin i, poc: 0.03 ng/mL (ref 0.00–0.08)

## 2016-02-14 LAB — BASIC METABOLIC PANEL
Anion gap: 15 (ref 5–15)
BUN: 30 mg/dL — AB (ref 6–20)
CHLORIDE: 95 mmol/L — AB (ref 101–111)
CO2: 19 mmol/L — AB (ref 22–32)
CREATININE: 3.28 mg/dL — AB (ref 0.61–1.24)
Calcium: 10.2 mg/dL (ref 8.9–10.3)
GFR calc Af Amer: 26 mL/min — ABNORMAL LOW (ref >60)
GFR calc non Af Amer: 22 mL/min — ABNORMAL LOW (ref >60)
GLUCOSE: 112 mg/dL — AB (ref 65–99)
Potassium: 4.5 mmol/L (ref 3.5–5.1)
SODIUM: 129 mmol/L — AB (ref 135–145)

## 2016-02-14 LAB — CBC
HCT: 40.6 % (ref 39.0–52.0)
Hemoglobin: 13.7 g/dL (ref 13.0–17.0)
MCH: 28.7 pg (ref 26.0–34.0)
MCHC: 33.7 g/dL (ref 30.0–36.0)
MCV: 84.9 fL (ref 78.0–100.0)
PLATELETS: 171 10*3/uL (ref 150–400)
RBC: 4.78 MIL/uL (ref 4.22–5.81)
RDW: 14.3 % (ref 11.5–15.5)
WBC: 4.3 10*3/uL (ref 4.0–10.5)

## 2016-02-14 NOTE — ED Notes (Signed)
C/o generalized cramps all over (worse in legs) since 12pm today.  Denies any other symptoms.

## 2016-02-15 ENCOUNTER — Emergency Department (HOSPITAL_COMMUNITY)
Admission: EM | Admit: 2016-02-15 | Discharge: 2016-02-15 | Disposition: A | Discharge status: Home / Self Care | Payer: Medicaid Other | Source: Home / Self Care | Attending: Emergency Medicine | Admitting: Emergency Medicine

## 2016-02-15 ENCOUNTER — Observation Stay (HOSPITAL_COMMUNITY)
Admission: EM | Admit: 2016-02-15 | Discharge: 2016-02-15 | Disposition: A | Discharge status: Home / Self Care | Payer: Medicaid Other | Attending: Internal Medicine | Admitting: Internal Medicine

## 2016-02-15 ENCOUNTER — Encounter (HOSPITAL_COMMUNITY): Payer: Self-pay | Admitting: Neurology

## 2016-02-15 ENCOUNTER — Observation Stay (HOSPITAL_COMMUNITY): Payer: Medicaid Other

## 2016-02-15 DIAGNOSIS — F17200 Nicotine dependence, unspecified, uncomplicated: Secondary | ICD-10-CM

## 2016-02-15 DIAGNOSIS — M6282 Rhabdomyolysis: Secondary | ICD-10-CM

## 2016-02-15 DIAGNOSIS — E860 Dehydration: Secondary | ICD-10-CM

## 2016-02-15 DIAGNOSIS — N179 Acute kidney failure, unspecified: Secondary | ICD-10-CM | POA: Diagnosis not present

## 2016-02-15 DIAGNOSIS — F1410 Cocaine abuse, uncomplicated: Secondary | ICD-10-CM

## 2016-02-15 DIAGNOSIS — R252 Cramp and spasm: Secondary | ICD-10-CM | POA: Diagnosis present

## 2016-02-15 DIAGNOSIS — F1490 Cocaine use, unspecified, uncomplicated: Secondary | ICD-10-CM

## 2016-02-15 LAB — I-STAT CHEM 8, ED
BUN: 20 mg/dL (ref 6–20)
CALCIUM ION: 1.07 mmol/L — AB (ref 1.12–1.23)
CREATININE: 1.3 mg/dL — AB (ref 0.61–1.24)
Chloride: 113 mmol/L — ABNORMAL HIGH (ref 101–111)
GLUCOSE: 93 mg/dL (ref 65–99)
HCT: 38 % — ABNORMAL LOW (ref 39.0–52.0)
Hemoglobin: 12.9 g/dL — ABNORMAL LOW (ref 13.0–17.0)
Potassium: 4.3 mmol/L (ref 3.5–5.1)
SODIUM: 140 mmol/L (ref 135–145)
TCO2: 15 mmol/L (ref 0–100)

## 2016-02-15 LAB — BASIC METABOLIC PANEL
Anion gap: 9 (ref 5–15)
BUN: 20 mg/dL (ref 6–20)
CHLORIDE: 107 mmol/L (ref 101–111)
CO2: 20 mmol/L — ABNORMAL LOW (ref 22–32)
CREATININE: 1.71 mg/dL — AB (ref 0.61–1.24)
Calcium: 8.7 mg/dL — ABNORMAL LOW (ref 8.9–10.3)
GFR calc Af Amer: 56 mL/min — ABNORMAL LOW (ref >60)
GFR, EST NON AFRICAN AMERICAN: 49 mL/min — AB (ref >60)
GLUCOSE: 99 mg/dL (ref 65–99)
POTASSIUM: 4.1 mmol/L (ref 3.5–5.1)
SODIUM: 136 mmol/L (ref 135–145)

## 2016-02-15 LAB — URINALYSIS, ROUTINE W REFLEX MICROSCOPIC
BILIRUBIN URINE: NEGATIVE
GLUCOSE, UA: NEGATIVE mg/dL
HGB URINE DIPSTICK: NEGATIVE
Ketones, ur: NEGATIVE mg/dL
Leukocytes, UA: NEGATIVE
Nitrite: NEGATIVE
PROTEIN: NEGATIVE mg/dL
Specific Gravity, Urine: 1.014 (ref 1.005–1.030)
pH: 5 (ref 5.0–8.0)

## 2016-02-15 LAB — RAPID URINE DRUG SCREEN, HOSP PERFORMED
AMPHETAMINES: NOT DETECTED
BENZODIAZEPINES: NOT DETECTED
Barbiturates: NOT DETECTED
COCAINE: POSITIVE — AB
OPIATES: NOT DETECTED
Tetrahydrocannabinol: NOT DETECTED

## 2016-02-15 LAB — CREATININE, URINE, RANDOM: CREATININE, URINE: 56.37 mg/dL

## 2016-02-15 LAB — SODIUM, URINE, RANDOM: Sodium, Ur: 14 mmol/L

## 2016-02-15 LAB — CK: CK TOTAL: 1154 U/L — AB (ref 49–397)

## 2016-02-15 MED ORDER — SODIUM CHLORIDE 0.9 % IV SOLN
Freq: Once | INTRAVENOUS | Status: AC
  Administered 2016-02-15: 05:00:00 via INTRAVENOUS

## 2016-02-15 MED ORDER — SODIUM CHLORIDE 0.9 % IV BOLUS (SEPSIS)
2000.0000 mL | Freq: Once | INTRAVENOUS | Status: AC
  Administered 2016-02-15: 2000 mL via INTRAVENOUS

## 2016-02-15 MED ORDER — HEPARIN SODIUM (PORCINE) 5000 UNIT/ML IJ SOLN
5000.0000 [IU] | Freq: Three times a day (TID) | INTRAMUSCULAR | Status: DC
  Administered 2016-02-15: 5000 [IU] via SUBCUTANEOUS
  Filled 2016-02-15: qty 1

## 2016-02-15 MED ORDER — SODIUM CHLORIDE 0.9 % IV BOLUS (SEPSIS)
1000.0000 mL | Freq: Once | INTRAVENOUS | Status: AC
  Administered 2016-02-15: 1000 mL via INTRAVENOUS

## 2016-02-15 MED ORDER — SODIUM CHLORIDE 0.9 % IV SOLN
INTRAVENOUS | Status: DC
  Administered 2016-02-15 (×2): via INTRAVENOUS

## 2016-02-15 NOTE — ED Notes (Signed)
Pt reports was just discharged today after leaving AMA against staying another day in the hospital. Pt had elevated creatinine. Pt left and had some time to think about it and he is ready to come back. Pt reports generalized body aches. Is a x 4. Eating chips.

## 2016-02-15 NOTE — Progress Notes (Signed)
Pt states he did not want his IVF.  Disconnected pt from IVF and then pt stated he was leaving AMA.  I informed the MD and asked for her to come explain to pt why he was admitted and needed to stay.  MD stated she had already explained that to the pt earlier today and to have pt sign the AMA form.  Pt signed AMA form, RN removed IV and telemetry.  Hector ShadeMoss, [place] K[place]io [place]

## 2016-02-15 NOTE — H&P (Addendum)
History and Physical    Wesley Blair [place] RUE:[id number] DOB: 1976/03/22 DOA: 02/15/2016   PCP: Default, Provider, MD Chief Complaint:  Chief Complaint  Patient presents with  . cramps     HPI: Wesley Blair is a 40 y.o. male who presents to the ED with c/o new, diffuse, 10/10 severity, muscle cramping.  Symptoms onset about 13 hours ago while at work.  He works outside, was sweating a lot, and only had 3x 16oz water bottles to drink today.  Has been able to visualize muscle spasms.  Associated symptoms include nausea.  No known history of kidney disease previously.  ED Course: Found to have AKF with creatinine of 3.2.  CPK of 1.1k  Review of Systems: As per HPI otherwise 10 point review of systems negative.    History reviewed. No pertinent past medical history.  History reviewed. No pertinent past surgical history.   reports that he has been smoking.  He has never used smokeless tobacco. He reports that he drinks alcohol. He reports that he does not use illicit drugs.  No Known Allergies  No family history on file.   Prior to Admission medications   Medication Sig Start Date End Date Taking? Authorizing Provider  HYDROcodone-acetaminophen (NORCO/VICODIN) 5-325 MG per tablet Take 2 tablets by mouth every 6 (six) hours as needed. Patient not taking: Reported on 02/15/2016 11/04/13   Irish EldersKelly Walker, NP    Physical Exam: Filed Vitals:   02/14/16 2348 02/15/16 0235 02/15/16 0341  BP: 137/87 138/91 132/76  Pulse: 90 95 78  Temp: 98.2 F (36.8 C)    TempSrc: Oral    Resp: 18 16 16   SpO2: 100% 100% 99%      Constitutional: NAD, calm, comfortable Eyes: PERRL, lids and conjunctivae normal ENMT: Mucous membranes are moist. Posterior pharynx clear of any exudate or lesions.Normal dentition.  Neck: normal, supple, no masses, no thyromegaly Respiratory: clear to auscultation bilaterally, no wheezing, no crackles. Normal respiratory effort. No accessory muscle use.  Cardiovascular:  Regular rate and rhythm, no murmurs / rubs / gallops. No extremity edema. 2+ pedal pulses. No carotid bruits.  Abdomen: no tenderness, no masses palpated. No hepatosplenomegaly. Bowel sounds positive.  Musculoskeletal: no clubbing / cyanosis. No joint deformity upper and lower extremities. Good ROM, no contractures. Normal muscle tone.  Skin: no rashes, lesions, ulcers. No induration Neurologic: CN 2-12 grossly intact. Sensation intact, DTR normal. Strength 5/5 in all 4.  Psychiatric: Normal judgment and insight. Alert and oriented x 3. Normal mood.    Labs on Admission: I have personally reviewed following labs and imaging studies  CBC:  Recent Labs Lab 02/14/16 2107  WBC 4.3  HGB 13.7  HCT 40.6  MCV 84.9  PLT 171   Basic Metabolic Panel:  Recent Labs Lab 02/14/16 2107  NA 129*  K 4.5  CL 95*  CO2 19*  GLUCOSE 112*  BUN 30*  CREATININE 3.28*  CALCIUM 10.2   GFR: CrCl cannot be calculated (Unknown ideal weight.). Liver Function Tests: No results for input(s): AST, ALT, ALKPHOS, BILITOT, PROT, ALBUMIN in the last 168 hours. No results for input(s): LIPASE, AMYLASE in the last 168 hours. No results for input(s): AMMONIA in the last 168 hours. Coagulation Profile: No results for input(s): INR, PROTIME in the last 168 hours. Cardiac Enzymes:  Recent Labs Lab 02/15/16 0153  CKTOTAL 1154*   BNP (last 3 results) No results for input(s): PROBNP in the last 8760 hours. HbA1C: No results for input(s): HGBA1C in the last  72 hours. CBG: No results for input(s): GLUCAP in the last 168 hours. Lipid Profile: No results for input(s): CHOL, HDL, LDLCALC, TRIG, CHOLHDL, LDLDIRECT in the last 72 hours. Thyroid Function Tests: No results for input(s): TSH, T4TOTAL, FREET4, T3FREE, THYROIDAB in the last 72 hours. Anemia Panel: No results for input(s): VITAMINB12, FOLATE, FERRITIN, TIBC, IRON, RETICCTPCT in the last 72 hours. Urine analysis:    Component Value Date/Time    COLORURINE YELLOW 02/15/2016 0323   APPEARANCEUR HAZY* 02/15/2016 0323   LABSPEC 1.014 02/15/2016 0323   PHURINE 5.0 02/15/2016 0323   GLUCOSEU NEGATIVE 02/15/2016 0323   HGBUR NEGATIVE 02/15/2016 0323   BILIRUBINUR NEGATIVE 02/15/2016 0323   KETONESUR NEGATIVE 02/15/2016 0323   PROTEINUR NEGATIVE 02/15/2016 0323   NITRITE NEGATIVE 02/15/2016 0323   LEUKOCYTESUR NEGATIVE 02/15/2016 0323   Sepsis Labs: (procalcitonin:4,lacticidven:4) )No results found for this or any previous visit (from the past 240 hour(s)).   Radiological Exams on Admission: No results found.  EKG: Independently reviewed.  Assessment/Plan Principal Problem:   Acute kidney failure (HCC) Active Problems:   Dehydration   Muscle cramps  AKF - likely due to dehydration, has associated muscle cramps  NS 2L bolus ordered in ED  125 cc/hr after  Repeat BMP ordered at 0800  Strict intake and output  UA also ordered  Due to lower than expected specific gravity of urine 1.014 ordering FeNA and renal US.   DVT prophylaxis: Heparin Hall Code Status: Full Family Communication: No family in room Consults called: None Admission status: Place in Obs   Hillary Bow. DO [facility] Pager [phone] from 7PM-7AM  If 7AM-7PM, please contact the day physician for the patient [email or web address] Password [email or web address]  02/15/2016, 3:52 AM

## 2016-02-15 NOTE — ED Provider Notes (Signed)
CSN: [id number]     Arrival date & time 02/15/16  1300 History  By signing my name below, I, [facility] Washington, attest that this documentation has been prepared under the direction and in the presence of Melene Planan Daymeon Fischman, DO. Electronically Signed: Randell PatientMarrissa Washington, ED Scribe. 02/15/2016. 7:07 PM.   Chief Complaint  Patient presents with  . Follow-up    The history is provided by the patient. No language interpreter was used.  HPI Comments: Beckie SaltsZurich Bermea is a 40 y.o. male who presents to the Emergency Department complaining of acute, moderate, generalized muscle cramping in his BLE, BUE and back onset 2 days ago. Pt states that he was admitted for the same complaint yesterday, left AMA earlier today despite continuing pain and nausea, and returned to the ED 20 minutes after being discharged. He reports emesis one time since discharge. Per medical records, pt was evaluated by Dr. Jaci Carrelhristopher Pollina where his blood work revealed that he had elevated creatinine levels and [facility] with IV hydration and a recheck of his kidney function. Denies cocaine use or physical exercise since discharge. Denies any other symptoms currently.   History reviewed. No pertinent past medical history. History reviewed. No pertinent past surgical history. No family history on file. Social History  Substance Use Topics  . Smoking status: Current Every Day Smoker  . Smokeless tobacco: Never Used  . Alcohol Use: Yes     Comment: Socailly     Review of Systems  Constitutional: Negative for fever and chills.  HENT: Negative for congestion and facial swelling.   Eyes: Negative for discharge and visual disturbance.  Respiratory: Negative for shortness of breath.   Cardiovascular: Negative for chest pain and palpitations.  Gastrointestinal: Positive for nausea and vomiting. Negative for abdominal pain and diarrhea.  Musculoskeletal: Positive for myalgias. Negative for arthralgias.  Skin:  Negative for color change and rash.  Neurological: Negative for tremors, syncope and headaches.  Psychiatric/Behavioral: Negative for confusion and dysphoric mood.  All other systems reviewed and are negative.    Allergies  Review of patient's allergies indicates no known allergies.  Home Medications   Prior to Admission medications   Not on File   BP 124/84 mmHg  Pulse 71  Temp(Src) 98.6 F (37 C) (Oral)  Resp 20  SpO2 100% Physical Exam  Constitutional: He is oriented to person, place, and time. He appears well-developed and well-nourished.  HENT:  Head: Normocephalic and atraumatic.  Eyes: EOM are normal. Pupils are equal, round, and reactive to light.  Neck: Normal range of motion. Neck supple. No JVD present.  Cardiovascular: Normal rate and regular rhythm.  Exam reveals no gallop and no friction rub.   No murmur heard. Pulmonary/Chest: No respiratory distress. He has no wheezes.  Abdominal: He exhibits no distension. There is no rebound and no guarding.  Musculoskeletal: Normal range of motion.  Neurological: He is alert and oriented to person, place, and time.  Skin: No rash noted. No pallor.  Psychiatric: He has a normal mood and affect. His behavior is normal.  Nursing note and vitals reviewed.   ED Course  Procedures   DIAGNOSTIC STUDIES: Oxygen Saturation is 100% on RA, normal by my interpretation.    COORDINATION OF CARE: 5:54 PM Will order IV fluids and labs. Discussed treatment plan with pt at bedside and pt agreed to plan.   Labs Review Labs Reviewed  I-STAT CHEM 8, ED - Abnormal; Notable for the following:    Chloride 113 (*)  Creatinine, Ser 1.30 (*)    Calcium, Ion 1.07 (*)    Hemoglobin 12.9 (*)    HCT 38.0 (*)    All other components within normal limits    Imaging Review US Renal  02/15/2016  CLINICAL DATA:  Acute kidney injury. EXAM: RENAL / URINARY TRACT ULTRASOUND COMPLETE COMPARISON:  None. FINDINGS: Right Kidney: Length: 10.3  cm. Echogenicity within normal limits. No mass or hydronephrosis visualized. Left Kidney: Length: 10.6 cm. Echogenicity within normal limits. No mass or hydronephrosis visualized. Bladder: No bladder wall thickening or focal filling defects identified. Somewhat limited visualization due to incomplete filling. IMPRESSION: Normal ultrasound appearance of the kidneys and bladder. No evidence of hydronephrosis. Electronically Signed   By: Burman Nieves M.D.   On: 02/15/2016 05:11   I have personally reviewed and evaluated these images and lab results as part of my medical decision-making.   EKG Interpretation None      MDM   Final diagnoses:  Dehydration    40 yo M With a chief complaint of muscle aches. This been going on for the past few days. Patient was admitted for the same. Found to be in rhabdomyolysis. They suspected this is secondary to his cocaine abuse. Patient left AMA this morning. He is back because his symptoms have not improved. Repeat creatinine is significantly improved. There was a lab error on sending off the CK. Patient is not willing to wait for repeat lab draw. We'll have him follow-up with his family doctor.  9:55 PM:  I have discussed the diagnosis/risks/treatment options with the patient and believe the pt to be eligible for discharge home to follow-up with PCP. We also discussed returning to the ED immediately if new or worsening sx occur. We discussed the sx which are most concerning (e.g., worsening muscle pains, fevers, decreased urine output) that necessitate immediate return. Medications administered to the patient during their visit and any new prescriptions provided to the patient are listed below.  Medications given during this visit Medications  sodium chloride 0.9 % bolus 2,000 mL (0 mLs Intravenous Stopped 02/15/16 1947)    There are no discharge medications for this patient.   The patient appears reasonably screen and/or stabilized for discharge and I  doubt any other medical condition or other [facility] requiring further screening, evaluation, or treatment in the ED at this time prior to discharge.        Melene Plan, DO 02/15/16 2155

## 2016-02-15 NOTE — Discharge Summary (Signed)
Physician Discharge Summary  Wesley Blair WJX:[id number] DOB: September 26, 1975 DOA: 02/15/2016  PCP: Default, Provider, MD  Admit date: 02/15/2016 Patient left AMA on  02/15/2016  Discharge Diagnoses:  Principal Problem:   Acute kidney failure (HCC) Active Problems:   Dehydration   Muscle cramps   Cocaine use   Wt Readings from Last 3 Encounters:  02/15/16 74.7 kg (164 lb 10.9 oz)  09/03/12 77.111 kg (170 lb)    History of present illness:   Wesley Blair is a 40 y.o. male who presented to the ED with new, diffuse, 10/10 severity, muscle cramping. Symptoms onset about 13 hours ago while at work. He works outside, was sweating a lot, and only had 3x 16oz water bottles to drink.  He had muscle spasms and nausea. No known history of kidney disease previously.  Creatinine was 3.2, sodium 129, CPK of 1100.  He was hospitalized and given IVF.    Hospital Course:   Acute renal failure, hyponatremia, mild rhabdomyolysis likely due to dehydration and cocaine use which were suggested by moderately elevated CPK.  He was given IVF and his creatinine trended down to 1.71 and his uop increased to 1300.  His hyponatremia resolved.  Recommended another 12-24 hours of IVF and repeat creatinine and CPK tomorrow, however, patient elected to leave AMA.    Cocaine use.  Counseled cessation.    Procedures:  none  Consultations:  none  Discharge Exam: Filed Vitals:   02/15/16 0341 02/15/16 0445  BP: 132/76 139/93  Pulse: 78 89  Temp:  98.8 F (37.1 C)  Resp: 16 17   Filed Vitals:   02/14/16 2348 02/15/16 0235 02/15/16 0341 02/15/16 0445  BP: 137/87 138/91 132/76 139/93  Pulse: 90 95 78 89  Temp: 98.2 F (36.8 C)   98.8 F (37.1 C)  TempSrc: Oral   Oral  Resp: 18 16 16 17   Height:    6' (1.829 m)  Weight:    74.7 kg (164 lb 10.9 oz)  SpO2: 100% 100% 99% 100%    General: adult male, no acute distress, poor eye contact, sweating slightly  Cardiovascular: regular rate and rhythm, no  murmurs, rubs or gallops Respiratory: clear to auscultation bilaterally ABD: normal active bowel sounds, soft, nondistended, nontender MSK:  Normal tone and bulk, no lower extremity edema  Discharge Instructions     Medication List    STOP taking these medications        HYDROcodone-acetaminophen 5-325 MG tablet  Commonly known as:  NORCO/VICODIN          The results of significant diagnostics from this hospitalization (including imaging, microbiology, ancillary and laboratory) are listed below for reference.    Significant Diagnostic Studies: [place] Renal  02/15/2016  CLINICAL DATA:  Acute kidney injury. EXAM: RENAL / URINARY TRACT ULTRASOUND COMPLETE COMPARISON:  None. FINDINGS: Right Kidney: Length: 10.3 cm. Echogenicity within normal limits. No mass or hydronephrosis visualized. Left Kidney: Length: 10.6 cm. Echogenicity within normal limits. No mass or hydronephrosis visualized. Bladder: No bladder wall thickening or focal filling defects identified. Somewhat limited visualization due to incomplete filling. IMPRESSION: Normal ultrasound appearance of the kidneys and bladder. No evidence of hydronephrosis. Electronically Signed   By: Burman NievesWilliam  Stevens M.D.   On: 02/15/2016 05:11    Microbiology: No results found for this or any previous visit (from the past 240 hour(s)).   Labs: Basic Metabolic Panel:  Recent Labs Lab 02/14/16 2107 02/15/16 0917  NA 129* 136  K 4.5 4.1  CL 95*  107  CO2 19* 20*  GLUCOSE 112* 99  BUN 30* 20  CREATININE 3.28* 1.71*  CALCIUM 10.2 8.7*   Liver Function Tests: No results for input(s): AST, ALT, ALKPHOS, BILITOT, PROT, ALBUMIN in the last 168 hours. No results for input(s): LIPASE, AMYLASE in the last 168 hours. No results for input(s): AMMONIA in the last 168 hours. CBC:  Recent Labs Lab 02/14/16 2107  WBC 4.3  HGB 13.7  HCT 40.6  MCV 84.9  PLT 171   Cardiac Enzymes:  Recent Labs Lab 02/15/16 0153  CKTOTAL 1154*    BNP: BNP (last 3 results) No results for input(s): BNP in the last 8760 hours.  ProBNP (last 3 results) No results for input(s): PROBNP in the last 8760 hours.  CBG: No results for input(s): GLUCAP in the last 168 hours.  Time coordinating discharge: 35 minutes  Signed:  Stormey Wilborn  [facility] 02/15/2016, 12:40 PM

## 2016-02-15 NOTE — ED Notes (Signed)
Patient placed into a gown and on the monitor 

## 2016-02-15 NOTE — ED Notes (Signed)
Labs resulted at 0917, will not order more at this time,

## 2016-02-15 NOTE — ED Notes (Signed)
Pt states he no longer wants to wait on lab; Pt refused new lab draw for CK; Md notifed and pt was discharged

## 2016-02-15 NOTE — ED Provider Notes (Signed)
CSN: [id number]     Arrival date & time 02/14/16  2034 History  By signing my name below, I, Wesley Blair, attest that this documentation has been prepared under the direction and in the presence of Gilda Crease, MD. Electronically Signed: Bethel Blair, ED Scribe. 02/15/2016. 2:55 AM    Chief Complaint  Patient presents with  . cramps    The history is provided by the patient. No language interpreter was used.   Wesley Blair is a 40 y.o. male who presents to the Emergency Department complaining of new, diffuse, 10/10 in severity,  muscle cramping with onset approximately 13 hours ago at work. The pt works outside and notes that the pain started in his hands but spread diffusely.He notes that he has been able to visualize some of the spasms in his chest.  Associated symptoms include nausea. He denies any known kidney disease.   History reviewed. No pertinent past medical history. History reviewed. No pertinent past surgical history. No family history on file. Social History  Substance Use Topics  . Smoking status: Current Every Day Smoker  . Smokeless tobacco: Never Used  . Alcohol Use: Yes     Comment: Socailly     Review of Systems  Gastrointestinal: Positive for nausea.  Musculoskeletal: Positive for myalgias.  All other systems reviewed and are negative.  Allergies  Review of patient's allergies indicates no known allergies.  Home Medications   Prior to Admission medications   Medication Sig Start Date End Date Taking? Authorizing Provider  HYDROcodone-acetaminophen (NORCO/VICODIN) 5-325 MG per tablet Take 2 tablets by mouth every 6 (six) hours as needed. Patient not taking: Reported on 02/15/2016 11/04/13   Irish Elders, NP   BP 137/87 mmHg  Pulse 90  Temp(Src) 98.2 F (36.8 C) (Oral)  Resp 18  SpO2 100% Physical Exam  Constitutional: He is oriented to person, place, and time. He appears well-developed and well-nourished. No distress.  HENT:  Head:  Normocephalic and atraumatic.  Right Ear: Hearing normal.  Left Ear: Hearing normal.  Nose: Nose normal.  Mouth/Throat: Oropharynx is clear and moist and mucous membranes are normal.  Eyes: Conjunctivae and EOM are normal. Pupils are equal, round, and reactive to light.  Neck: Normal range of motion. Neck supple.  Cardiovascular: Regular rhythm, S1 normal and S2 normal.  Exam reveals no gallop and no friction rub.   No murmur heard. Pulmonary/Chest: Effort normal and breath sounds normal. No respiratory distress. He exhibits no tenderness.  Abdominal: Soft. Normal appearance and bowel sounds are normal. There is no hepatosplenomegaly. There is no tenderness. There is no rebound, no guarding, no tenderness at McBurney's point and negative Murphy's sign. No hernia.  Musculoskeletal: Normal range of motion.  Neurological: He is alert and oriented to person, place, and time. He has normal strength. No cranial nerve deficit or sensory deficit. Coordination normal. GCS eye subscore is 4. GCS verbal subscore is 5. GCS motor subscore is 6.  Skin: Skin is warm, dry and intact. No rash noted. No cyanosis.  Psychiatric: He has a normal mood and affect. His speech is normal and behavior is normal. Thought content normal.  Nursing note and vitals reviewed.   ED Course  Procedures (including critical care time) DIAGNOSTIC STUDIES: Oxygen Saturation is 100% on RA,  normal by my interpretation.    COORDINATION OF CARE: 1:37 AM Discussed treatment plan which includes lab work and IVF with pt at bedside and pt agreed to plan.  2:53 AM-Consult complete with Dr.Powell (  Nephrology). Patient case explained and discussed. Call ended at 2:54 AM  Labs Review Labs Reviewed  BASIC METABOLIC PANEL - Abnormal; Notable for the following:    Sodium 129 (*)    Chloride 95 (*)    CO2 19 (*)    Glucose, Bld 112 (*)    BUN 30 (*)    Creatinine, Ser 3.28 (*)    GFR calc non Af Amer 22 (*)    GFR calc Af Amer 26  (*)    All other components within normal limits  CK - Abnormal; Notable for the following:    Total CK 1154 (*)    All other components within normal limits  CBC  URINALYSIS, ROUTINE W REFLEX MICROSCOPIC (NOT AT [facility])  I-STAT TROPOININ, ED    Imaging Review No results found. I have personally reviewed and evaluated these lab results as part of my medical decision-making.   EKG Interpretation   Date/Time:  Wednesday February 14 2016 20:57:51 EDT Ventricular Rate:  94 PR Interval:  158 QRS Duration: 80 QT Interval:  362 QTC Calculation: 452 R Axis:   -56 Text Interpretation:  Normal sinus rhythm Biatrial enlargement Pulmonary  disease pattern Left anterior fascicular block Left ventricular  hypertrophy Abnormal ECG No previous tracing Confirmed by Martine Trageser  MD,  Carra Brindley ([id number]) on 02/15/2016 1:34:21 AM      MDM   Final diagnoses:  AKI (acute kidney injury) (HCC)  Non-traumatic rhabdomyolysis    Patient presented to the ER with complaints of diffuse cramping of his muscles. 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 He is feeling better now that he is "down, but still occasionally experiencing cramps of the large muscles in his extremities. Blood work reveals significant creatinine elevation. He tells me that he has never been told that he has any kidney disease in the past. This is felt to be most likely an acute kidney injury, secondary to heat exposure. He does, however, have elevated CPK at 1100.  Findings were discussed with Dr. Lowell GuitarPowell, on call for nephrology. He feels that the patient simply requires IV hydration, recommends saline. Did not specifically recommend bicarbonate, although bicarbonate coulf be added as well.  Patient will require hospitalization for continued hydration therapy and recheck of kidney function.  I personally performed the services described in this documentation, which was scribed in my presence. The recorded information has  been reviewed and is accurate.     Gilda Creasehristopher J Piotr , MD 02/15/16 [phone]

## 2016-05-12 ENCOUNTER — Encounter (HOSPITAL_COMMUNITY): Payer: Self-pay | Admitting: *Deleted

## 2016-05-12 ENCOUNTER — Emergency Department (HOSPITAL_COMMUNITY)
Admission: EM | Admit: 2016-05-12 | Discharge: 2016-05-12 | Disposition: A | Discharge status: Home / Self Care | Payer: Medicaid Other | Attending: Emergency Medicine | Admitting: Emergency Medicine

## 2016-05-12 ENCOUNTER — Emergency Department (HOSPITAL_COMMUNITY): Payer: Medicaid Other

## 2016-05-12 DIAGNOSIS — M62830 Muscle spasm of back: Secondary | ICD-10-CM | POA: Insufficient documentation

## 2016-05-12 DIAGNOSIS — M546 Pain in thoracic spine: Secondary | ICD-10-CM | POA: Diagnosis present

## 2016-05-12 DIAGNOSIS — F17200 Nicotine dependence, unspecified, uncomplicated: Secondary | ICD-10-CM | POA: Insufficient documentation

## 2016-05-12 MED ORDER — ACETAMINOPHEN 500 MG PO TABS
1000.0000 mg | ORAL_TABLET | Freq: Once | ORAL | Status: AC
  Administered 2016-05-12: 1000 mg via ORAL
  Filled 2016-05-12: qty 2

## 2016-05-12 NOTE — ED Provider Notes (Signed)
[facility]-EMERGENCY DEPT Provider Note   CSN: [id number] Arrival date & time: 05/12/16  0157  By signing my name below, I, Phillis Haggis, attest that this documentation has been prepared under the direction and in the presence of Melene Plan, DO. Electronically Signed: Phillis Haggis, ED Scribe. 05/12/16. 2:59 AM.  History   Chief Complaint Chief Complaint  Patient presents with  . Back Pain   The history is provided by the patient. No language interpreter was used.    HPI Comments: Wesley Blair is a 40 y.o. male with a hx of cocaine use who presents to the Emergency Department complaining of upper back pain. Pt said he hurt his back but does not remember what happened. He reports his worst pain is with palpation of the right trapezius area. Pt is a poor historian. He mumbles through responses to questions and speaks in a low volume. He denies other injury, numbness, or weakness.   History reviewed. No pertinent past medical history.  Patient Active Problem List   Diagnosis Date Noted  . Acute kidney failure (HCC) 02/15/2016  . Dehydration 02/15/2016  . Muscle cramps 02/15/2016  . Cocaine use 02/15/2016    History reviewed. No pertinent surgical history.     Home Medications    Prior to Admission medications   Not on File    Family History History reviewed. No pertinent family history.  Social History Social History  Substance Use Topics  . Smoking status: Current Every Day Smoker  . Smokeless tobacco: Never Used  . Alcohol use Yes     Comment: Socailly      Allergies   Review of patient's allergies indicates no known allergies.   Review of Systems Review of Systems  Constitutional: Negative for chills and fever.  HENT: Negative for congestion and facial swelling.   Eyes: Negative for discharge and visual disturbance.  Respiratory: Negative for shortness of breath.   Cardiovascular: Negative for chest pain and palpitations.  Gastrointestinal: Negative for  abdominal pain, diarrhea and vomiting.  Musculoskeletal: Positive for back pain. Negative for arthralgias and myalgias.  Skin: Negative for color change and rash.  Neurological: Negative for tremors, syncope and headaches.  Psychiatric/Behavioral: Negative for confusion and dysphoric mood.     Physical Exam Updated Vital Signs BP 108/74 (BP Location: Right Arm)   Pulse 82   Temp 98.3 F (36.8 C) (Oral)   Resp 14   SpO2 99%   Physical Exam  Constitutional: He appears well-developed and well-nourished.  HENT:  Head: Normocephalic and atraumatic.  Eyes: EOM are normal. Pupils are equal, round, and reactive to light.  Neck: Normal range of motion. Neck supple. No JVD present.  Cardiovascular: Normal rate and regular rhythm.  Exam reveals no gallop and no friction rub.   No murmur heard. Pulmonary/Chest: No respiratory distress. He has no wheezes.  Abdominal: He exhibits no distension. There is no rebound and no guarding.  Musculoskeletal: Normal range of motion.  Pain worst in right paraspinal musculature about the T2-4 area.   Neurological: He is alert.  Pt appears drunk with slurred speech  Skin: No rash noted. No pallor.  Psychiatric: He has a normal mood and affect. His behavior is normal.  Nursing note and vitals reviewed.    ED Treatments / Results  DIAGNOSTIC STUDIES: Oxygen Saturation is 98% on RA, normal by my interpretation.    COORDINATION OF CARE: 2:55 AM-Discussed treatment plan which includes x-ray with pt at bedside and pt agreed to plan.  Labs (all labs ordered are listed, but only abnormal results are displayed) Labs Reviewed - No data to display  EKG  EKG Interpretation None       Radiology Dg Thoracic Spine 2 View  Result Date: 05/12/2016 CLINICAL DATA:  Acute onset of upper back pain.  Initial encounter. EXAM: THORACIC SPINE 2 VIEWS COMPARISON:  None. FINDINGS: There is no evidence of fracture or subluxation. Vertebral bodies demonstrate  normal height and alignment. Intervertebral disc spaces are preserved. The visualized portions of both lungs are clear. The mediastinum is unremarkable in appearance. IMPRESSION: No evidence of fracture or subluxation along the thoracic spine. Electronically Signed   By: Roanna RaiderJeffery  Chang M.D.   On: 05/12/2016 03:42    Procedures Procedures (including critical care time)  Medications Ordered in ED Medications  acetaminophen (TYLENOL) tablet 1,000 mg (1,000 mg Oral Given 05/12/16 [id number])     Initial Impression / Assessment and Plan / ED Course  I have reviewed the triage vital signs and the nursing notes.  Pertinent labs & imaging results that were available during my care of the patient were reviewed by me and considered in my medical decision making (see chart for details).  Clinical Course    40 yo M with upper back pain, post injury, though unable to describe.  Patient without signs of trauma to the back, pain worse in the paraspinal area.  Patient is intoxicated.   No noted fx.  Ambulates.  D/c home.   6:16 AM:  I have discussed the diagnosis/risks/treatment options with the patient and believe the pt to be eligible for discharge home to follow-up with PCP. We also discussed returning to the ED immediately if new or worsening sx occur. We discussed the sx which are most concerning (e.g., sudden worsening pain, fever, inability to tolerate by mouth) that necessitate immediate return. Medications administered to the patient during their visit and any new prescriptions provided to the patient are listed below.  Medications given during this visit Medications  acetaminophen (TYLENOL) tablet 1,000 mg (1,000 mg Oral Given 05/12/16 [id number])     The patient appears reasonably screen and/or stabilized for discharge and I doubt any other medical condition or other [facility] requiring further screening, evaluation, or treatment in the ED at this time prior to discharge.     Final Clinical Impressions(s)  / ED Diagnoses   Final diagnoses:  Muscle spasm of back  I personally performed the services described in this documentation, which was scribed in my presence. The recorded information has been reviewed and is accurate.    New Prescriptions There are no discharge medications for this patient.    Melene Planan Molleigh Huot, DO 05/12/16 [phone]

## 2016-05-12 NOTE — ED Triage Notes (Signed)
Pt c/o lower back pain for a couple of days. 

## 2016-05-12 NOTE — Discharge Instructions (Signed)
Take 4 over the counter ibuprofen tablets 3 times a day or 2 over-the-counter naproxen tablets twice a day for pain. Also take tylenol 1000mg(2 extra strength) four times a day.    

## 2016-05-12 NOTE — ED Notes (Signed)
Patient transported to X-ray 

## 2016-05-12 NOTE — Progress Notes (Signed)
Orthopedic Tech Progress Note Patient Details:  Wesley SaltsZurich Blair [birth year] [id number]  Ortho Devices Type of Ortho Device: Arm sling Ortho Device/Splint Location: rue Ortho Device/Splint Interventions: Ordered, Adjustment   Trinna PostMartinez, Tonnie Friedel J 05/12/2016, 3:28 AM

## 2016-05-12 NOTE — ED Notes (Signed)
Patient transported to x-ray. ?

## 2016-05-14 ENCOUNTER — Observation Stay (HOSPITAL_BASED_OUTPATIENT_CLINIC_OR_DEPARTMENT_OTHER): Payer: Medicaid Other

## 2016-05-14 ENCOUNTER — Encounter (HOSPITAL_COMMUNITY): Payer: Self-pay

## 2016-05-14 ENCOUNTER — Observation Stay (HOSPITAL_COMMUNITY): Payer: Medicaid Other

## 2016-05-14 ENCOUNTER — Emergency Department (HOSPITAL_COMMUNITY)
Admission: EM | Admit: 2016-05-14 | Discharge: 2016-05-14 | Disposition: A | Discharge status: Home / Self Care | Payer: Medicaid Other | Attending: Emergency Medicine | Admitting: Emergency Medicine

## 2016-05-14 ENCOUNTER — Observation Stay (HOSPITAL_COMMUNITY)
Admission: EM | Admit: 2016-05-14 | Discharge: 2016-05-16 | Disposition: A | Discharge status: Home / Self Care | Payer: Medicaid Other | Attending: Internal Medicine | Admitting: Internal Medicine

## 2016-05-14 DIAGNOSIS — M546 Pain in thoracic spine: Secondary | ICD-10-CM | POA: Diagnosis present

## 2016-05-14 DIAGNOSIS — F17200 Nicotine dependence, unspecified, uncomplicated: Secondary | ICD-10-CM | POA: Insufficient documentation

## 2016-05-14 DIAGNOSIS — R55 Syncope and collapse: Principal | ICD-10-CM | POA: Diagnosis present

## 2016-05-14 DIAGNOSIS — R42 Dizziness and giddiness: Secondary | ICD-10-CM | POA: Diagnosis present

## 2016-05-14 DIAGNOSIS — F1490 Cocaine use, unspecified, uncomplicated: Secondary | ICD-10-CM | POA: Diagnosis present

## 2016-05-14 DIAGNOSIS — M62830 Muscle spasm of back: Secondary | ICD-10-CM | POA: Insufficient documentation

## 2016-05-14 DIAGNOSIS — F1410 Cocaine abuse, uncomplicated: Secondary | ICD-10-CM | POA: Diagnosis not present

## 2016-05-14 LAB — BASIC METABOLIC PANEL
ANION GAP: 8 (ref 5–15)
BUN: 22 mg/dL — ABNORMAL HIGH (ref 6–20)
CALCIUM: 9.1 mg/dL (ref 8.9–10.3)
CO2: 22 mmol/L (ref 22–32)
Chloride: 109 mmol/L (ref 101–111)
Creatinine, Ser: 1 mg/dL (ref 0.61–1.24)
Glucose, Bld: 75 mg/dL (ref 65–99)
POTASSIUM: 3.8 mmol/L (ref 3.5–5.1)
SODIUM: 139 mmol/L (ref 135–145)

## 2016-05-14 LAB — CBC
HEMATOCRIT: 38.2 % — AB (ref 39.0–52.0)
HEMOGLOBIN: 12.9 g/dL — AB (ref 13.0–17.0)
MCH: 29.1 pg (ref 26.0–34.0)
MCHC: 33.8 g/dL (ref 30.0–36.0)
MCV: 86 fL (ref 78.0–100.0)
Platelets: 155 10*3/uL (ref 150–400)
RBC: 4.44 MIL/uL (ref 4.22–5.81)
RDW: 13.3 % (ref 11.5–15.5)
WBC: 2.5 10*3/uL — AB (ref 4.0–10.5)

## 2016-05-14 LAB — URINALYSIS, ROUTINE W REFLEX MICROSCOPIC
Glucose, UA: NEGATIVE mg/dL
HGB URINE DIPSTICK: NEGATIVE
Ketones, ur: NEGATIVE mg/dL
Leukocytes, UA: NEGATIVE
NITRITE: NEGATIVE
PROTEIN: NEGATIVE mg/dL
SPECIFIC GRAVITY, URINE: 1.036 — AB (ref 1.005–1.030)
pH: 5.5 (ref 5.0–8.0)

## 2016-05-14 LAB — RAPID URINE DRUG SCREEN, HOSP PERFORMED
Amphetamines: NOT DETECTED
BARBITURATES: NOT DETECTED
Benzodiazepines: NOT DETECTED
Cocaine: POSITIVE — AB
Opiates: NOT DETECTED
Tetrahydrocannabinol: NOT DETECTED

## 2016-05-14 LAB — ECHOCARDIOGRAM COMPLETE
HEIGHTINCHES: 72 in
WEIGHTICAEL: 2720 [oz_av]

## 2016-05-14 LAB — I-STAT CHEM 8, ED
BUN: 22 mg/dL — ABNORMAL HIGH (ref 6–20)
Calcium, Ion: 1.22 mmol/L (ref 1.15–1.40)
Chloride: 105 mmol/L (ref 101–111)
Creatinine, Ser: 1.1 mg/dL (ref 0.61–1.24)
GLUCOSE: 83 mg/dL (ref 65–99)
HCT: 42 % (ref 39.0–52.0)
HEMOGLOBIN: 14.3 g/dL (ref 13.0–17.0)
Potassium: 3.7 mmol/L (ref 3.5–5.1)
SODIUM: 143 mmol/L (ref 135–145)
TCO2: 25 mmol/L (ref 0–100)

## 2016-05-14 LAB — I-STAT TROPONIN, ED: TROPONIN I, POC: 0 ng/mL (ref 0.00–0.08)

## 2016-05-14 LAB — ETHANOL

## 2016-05-14 LAB — CBG MONITORING, ED: GLUCOSE-CAPILLARY: 85 mg/dL (ref 65–99)

## 2016-05-14 MED ORDER — METHOCARBAMOL 500 MG PO TABS
1000.0000 mg | ORAL_TABLET | Freq: Once | ORAL | Status: AC
Start: 2016-05-14 — End: 2016-05-14
  Administered 2016-05-14: 1000 mg via ORAL
  Filled 2016-05-14: qty 2

## 2016-05-14 MED ORDER — METHOCARBAMOL 500 MG PO TABS: 500.0000 mg | ORAL_TABLET | Freq: Two times a day (BID) | ORAL | 0 refills | Status: DC

## 2016-05-14 MED ORDER — NAPROXEN 375 MG PO TABS: 375.0000 mg | ORAL_TABLET | Freq: Two times a day (BID) | ORAL | 0 refills | Status: DC

## 2016-05-14 MED ORDER — ACETAMINOPHEN 500 MG PO TABS
1000.0000 mg | ORAL_TABLET | Freq: Once | ORAL | Status: AC
  Administered 2016-05-14: 1000 mg via ORAL
  Filled 2016-05-14: qty 2

## 2016-05-14 MED ORDER — ENOXAPARIN SODIUM 40 MG/0.4ML ~~LOC~~ SOLN
40.0000 mg | SUBCUTANEOUS | Status: DC
  Administered 2016-05-14 – 2016-05-15 (×2): 40 mg via SUBCUTANEOUS
  Filled 2016-05-14 (×2): qty 0.4

## 2016-05-14 MED ORDER — NAPROXEN 500 MG PO TABS
500.0000 mg | ORAL_TABLET | Freq: Once | ORAL | Status: AC
  Administered 2016-05-14: 500 mg via ORAL
  Filled 2016-05-14: qty 1

## 2016-05-14 NOTE — ED Notes (Signed)
Patient made aware of urine sample. Urinal placed at bedside and encouraged to void when able. 

## 2016-05-14 NOTE — Progress Notes (Signed)
  Echocardiogram 2D Echocardiogram has been performed.  Nolon RodBrown, Tony 05/14/2016, 2:46 PM

## 2016-05-14 NOTE — ED Provider Notes (Signed)
[facility]-EMERGENCY DEPT Provider Note   CSN: [id number] Arrival date & time: 05/14/16  4098     History   Chief Complaint Chief Complaint  Patient presents with  . Dizziness    HPI Wesley Blair is a 40 y.o. male.  HPI patient reports that he had syncopal event after leaving here this morning. Patient was evaluated here this morning for back pain. He suffers from chronic back pain. He had syncopal event after leaving here. He's had multiple syncopal events onset 1 month ago stating that he has 2 or 3 per week. He presently complains of mild lightheadedness with standing. Denies headache denies chest pain denies other complaint. Admits to using cocaine last time yesterday. No other associated symptoms. No treatment prior to coming here this visit.  History reviewed. No pertinent past medical history.  Patient Active Problem List   Diagnosis Date Noted  . Acute kidney failure (HCC) 02/15/2016  . Dehydration 02/15/2016  . Muscle cramps 02/15/2016  . Cocaine use 02/15/2016    History reviewed. No pertinent surgical history.     Home Medications    Prior to Admission medications   Medication Sig Start Date End Date Taking? Authorizing Provider  methocarbamol (ROBAXIN) 500 MG tablet Take 1 tablet (500 mg total) by mouth 2 (two) times daily. 05/14/16   April Palumbo, MD  naproxen (NAPROSYN) 375 MG tablet Take 1 tablet (375 mg total) by mouth 2 (two) times daily. 05/14/16   Cy Blamer, MD    Family History History reviewed. No pertinent family history.  Social History Social History  Substance Use Topics  . Smoking status: Current Every Day Smoker  . Smokeless tobacco: Never Used  . Alcohol use Yes     Comment: Socailly    Admits to cocaine use several times per week. Snorts cocaine. No history of IV drug use  Allergies   Review of patient's allergies indicates no known allergies.   Review of Systems Review of Systems  Constitutional: Negative.   HENT: Negative.     Respiratory: Negative.   Cardiovascular: Positive for chest pain.       Syncope  Gastrointestinal: Negative.   Musculoskeletal: Positive for back pain.  Skin: Negative.   Allergic/Immunologic: Negative.   Neurological: Positive for light-headedness.  Psychiatric/Behavioral: Negative.   All other systems reviewed and are negative.    Physical Exam Updated Vital Signs BP 131/90 (BP Location: Right Arm)   Pulse (!) 58   Temp 97.9 F (36.6 C) (Oral)   Resp 18   SpO2 100%   Physical Exam  Constitutional: He is oriented to person, place, and time. He appears well-developed and well-nourished. No distress.  HENT:  Head: Normocephalic and atraumatic.  Eyes: Conjunctivae are normal. Pupils are equal, round, and reactive to light.  Neck: Neck supple. No tracheal deviation present. No thyromegaly present.  Cardiovascular: Normal rate and regular rhythm.   No murmur heard. Pulmonary/Chest: Effort normal and breath sounds normal.  Abdominal: Soft. Bowel sounds are normal. He exhibits no distension. There is no tenderness.  Musculoskeletal: Normal range of motion. He exhibits no edema or tenderness.  Neurological: He is alert and oriented to person, place, and time. He displays normal reflexes. He exhibits normal muscle tone. Coordination normal.  Gait normal Romberg normal pronator drift normal motor strength 5 over 5 overall DTRs symmetric bilaterally at knee jerk ankle jerk and biceps toes downward going bilaterally  Skin: Skin is warm and dry. No rash noted.  Psychiatric: He has a normal mood  and affect.  Nursing note and vitals reviewed.    ED Treatments / Results  Labs (all labs ordered are listed, but only abnormal results are displayed) Labs Reviewed  BASIC METABOLIC PANEL  CBC  URINALYSIS, ROUTINE W REFLEX MICROSCOPIC (NOT AT [facility])  URINE RAPID DRUG SCREEN, HOSP PERFORMED  CBG MONITORING, ED  I-STAT CHEM 8, ED  I-STAT TROPOININ, ED    EKG  EKG  Interpretation  Date/Time:  Tuesday May 14 2016 10:03:21 EDT Ventricular Rate:  53 PR Interval:    QRS Duration: 97 QT Interval:  442 QTC Calculation: 415 R Axis:   -50 Text Interpretation:  Sinus rhythm Left anterior fascicular block Abnormal R-wave progression, early transition Borderline ST elevation, anterior leads No significant change since last tracing Confirmed by Ethelda Chick  MD, Palmina Clodfelter [phone]) on 05/14/2016 10:10:20 AM      Results for orders placed or performed during the hospital encounter of 05/14/16  CBC  Result Value Ref Range   WBC 2.5 (L) 4.0 - 10.5 K/uL   RBC 4.44 4.22 - 5.81 MIL/uL   Hemoglobin 12.9 (L) 13.0 - 17.0 g/dL   HCT 11.9 (L) 14.7 - 82.9 %   MCV 86.0 78.0 - 100.0 fL   MCH 29.1 26.0 - 34.0 pg   MCHC 33.8 30.0 - 36.0 g/dL   RDW 56.2 13.0 - 86.5 %   Platelets 155 150 - 400 K/uL  Ethanol  Result Value Ref Range   Alcohol, Ethyl (B) <5 <5 mg/dL  CBG monitoring, ED  Result Value Ref Range   Glucose-Capillary 85 65 - 99 mg/dL  I-stat chem 8, ed  Result Value Ref Range   Sodium 143 135 - 145 mmol/L   Potassium 3.7 3.5 - 5.1 mmol/L   Chloride 105 101 - 111 mmol/L   BUN 22 (H) 6 - 20 mg/dL   Creatinine, Ser 7.84 0.61 - 1.24 mg/dL   Glucose, Bld 83 65 - 99 mg/dL   Calcium, Ion 6.96 2.95 - 1.40 mmol/L   TCO2 25 0 - 100 mmol/L   Hemoglobin 14.3 13.0 - 17.0 g/dL   HCT 28.4 13.2 - 44.0 %  I-stat troponin, ED  Result Value Ref Range   Troponin i, poc 0.00 0.00 - 0.08 ng/mL   Comment 3           Dg Thoracic Spine 2 View  Result Date: 05/12/2016 CLINICAL DATA:  Acute onset of upper back pain.  Initial encounter. EXAM: THORACIC SPINE 2 VIEWS COMPARISON:  None. FINDINGS: There is no evidence of fracture or subluxation. Vertebral bodies demonstrate normal height and alignment. Intervertebral disc spaces are preserved. The visualized portions of both lungs are clear. The mediastinum is unremarkable in appearance. IMPRESSION: No evidence of fracture or  subluxation along the thoracic spine. Electronically Signed   By: Roanna Raider M.D.   On: 05/12/2016 03:42    Radiology No results found.  Procedures Procedures (including critical care time)  Medications Ordered in ED Medications - No data to display  Results for orders placed or performed during the hospital encounter of 05/14/16  CBC  Result Value Ref Range   WBC 2.5 (L) 4.0 - 10.5 K/uL   RBC 4.44 4.22 - 5.81 MIL/uL   Hemoglobin 12.9 (L) 13.0 - 17.0 g/dL   HCT 10.2 (L) 72.5 - 36.6 %   MCV 86.0 78.0 - 100.0 fL   MCH 29.1 26.0 - 34.0 pg   MCHC 33.8 30.0 - 36.0 g/dL   RDW 44.0 34.7 -  15.5 %   Platelets 155 150 - 400 K/uL  Ethanol  Result Value Ref Range   Alcohol, Ethyl (B) <5 <5 mg/dL  CBG monitoring, ED  Result Value Ref Range   Glucose-Capillary 85 65 - 99 mg/dL  I-stat chem 8, ed  Result Value Ref Range   Sodium 143 135 - 145 mmol/L   Potassium 3.7 3.5 - 5.1 mmol/L   Chloride 105 101 - 111 mmol/L   BUN 22 (H) 6 - 20 mg/dL   Creatinine, Ser 4.091.10 0.61 - 1.24 mg/dL   Glucose, Bld 83 65 - 99 mg/dL   Calcium, Ion 8.111.22 9.141.15 - 1.40 mmol/L   TCO2 25 0 - 100 mmol/L   Hemoglobin 14.3 13.0 - 17.0 g/dL   HCT 78.242.0 95.639.0 - 21.352.0 %  I-stat troponin, ED  Result Value Ref Range   Troponin i, poc 0.00 0.00 - 0.08 ng/mL   Comment 3           Dg Thoracic Spine 2 View  Result Date: 05/12/2016 CLINICAL DATA:  Acute onset of upper back pain.  Initial encounter. EXAM: THORACIC SPINE 2 VIEWS COMPARISON:  None. FINDINGS: There is no evidence of fracture or subluxation. Vertebral bodies demonstrate normal height and alignment. Intervertebral disc spaces are preserved. The visualized portions of both lungs are clear. The mediastinum is unremarkable in appearance. IMPRESSION: No evidence of fracture or subluxation along the thoracic spine. Electronically Signed   By: Roanna RaiderJeffery  Chang M.D.   On: 05/12/2016 03:42   11:25 AM patient resting comfortably. No distress Glasgow Coma Score  15  Initial Impression / Assessment and Plan / ED Course  I have reviewed the triage vital signs and the nursing notes.  Pertinent labs & imaging results that were available during my care of the patient were reviewed by me and considered in my medical decision making (see chart for details).  Clinical Course    Dr Maeola HarmanGherghe Consulted and will see patient in the hospital. Plan 23 hour observation telemetry  Final Clinical Impressions(s) / ED Diagnoses  Diagnoses #1 syncope #2 substance abuse Final diagnoses:  None    New Prescriptions New Prescriptions   No medications on file     Doug SouSam Hanh Kertesz, MD 05/14/16 1135

## 2016-05-14 NOTE — ED Triage Notes (Signed)
Pt discharged this morning at 5 am with back pain.  Now patient states he has had episodes of his "brain blanking out" x 1 month.  Pt states he discharged and was walking home and fell.  States he didn't mention it this morning b/c he has just been dealing with it.

## 2016-05-14 NOTE — H&P (Signed)
History and Physical    Wesley Blair ZOX:[id number] DOB: 02/14/1976 DOA: 05/14/2016  PCP: Default, Provider, MD  Outpatient Specialists: none Patient coming from: home  Chief Complaint: Syncopal episodes  HPI: Wesley Blair is a 40 y.o. male without significant medical history, presents to the emergency room with complaints of syncopal episodes. Patient was seen several times in the last few days for back pain and muscle spasms, discharged home. Today's of first and that he mentioned to the EDP that he's been having syncopal episodes almost every day for the past month. Patient states that he can be in his normal state of health, walking on the street, and suddenly he starts feeling lightheaded, source to feel like he is about to pass out, is having difficulties walking and eventually falls down to ground passing out. He doesn't think he is down for long period of time and usually wakes up right away. He was here in the ER overnight for back pain, was discharged home, and he tells me he started walking away from the hospital, walked for about 40 minutes and found himself in front of the hospital again, and he does not know how this happened. Patient states that he's been doing cocaine almost every day, he states that he started cocaine about 2 months ago. He denies any fever or chills. He also complains of occasional sharp chest pains in the middle of his chest, lasting few seconds at a time, no apparent inciting factors. He denies any shortness of breath. He denies any palpitations. He has no abdominal pain, no nausea, vomiting or diarrhea. He also states that he has had a few syncopal episodes in the remote past when he was not using cocaine.  ED Course: In the emergency room, his vital signs are stable, his blood work is unremarkable, [facility] was asked for admission for syncope evaluation.  Review of Systems: As per HPI otherwise 10 point review of systems negative.   History reviewed. No pertinent  past medical history.  History reviewed. No pertinent surgical history.   reports that he has been smoking.  He has never used smokeless tobacco. He reports that he drinks alcohol. He reports that he does not use drugs.  No Known Allergies  History reviewed. No pertinent family history.  Prior to Admission medications   Medication Sig Start Date End Date Taking? Authorizing Provider  methocarbamol (ROBAXIN) 500 MG tablet Take 1 tablet (500 mg total) by mouth 2 (two) times daily. 05/14/16   April Palumbo, MD  naproxen (NAPROSYN) 375 MG tablet Take 1 tablet (375 mg total) by mouth 2 (two) times daily. 05/14/16   April Palumbo, MD    Physical Exam: Vitals:   05/14/16 0955 05/14/16 1100  BP: 131/90 132/99  Pulse: (!) 58 62  Resp: 18 19  Temp: 97.9 F (36.6 C)   TempSrc: Oral   SpO2: 100% 100%      Constitutional: NAD, calm, comfortable Vitals:   05/14/16 0955 05/14/16 1100  BP: 131/90 132/99  Pulse: (!) 58 62  Resp: 18 19  Temp: 97.9 F (36.6 C)   TempSrc: Oral   SpO2: 100% 100%   Eyes: PERRL, lids and conjunctivae normal ENMT: Mucous membranes are moist. Posterior pharynx clear of any exudate or lesions.Normal dentition.  Neck: normal, supple, no masses, no thyromegaly Respiratory: clear to auscultation bilaterally, no wheezing, no crackles. Normal respiratory effort. No accessory muscle use.  Cardiovascular: Regular rate and rhythm, no murmurs / rubs / gallops. No extremity edema. 2+ pedal  pulses.  Abdomen: no tenderness, no masses palpated. Bowel sounds positive.  Musculoskeletal: no clubbing / cyanosis. Normal muscle tone.  Skin: no rashes, lesions, ulcers. No induration Neurologic: CN 2-12 grossly intact. Strength 5/5 in all 4.  Psychiatric: Normal judgment and insight. Alert and oriented x 3. Normal mood.   Labs on Admission: I have personally reviewed following labs and imaging studies  CBC:  Recent Labs Lab 05/14/16 1033 05/14/16 1054  WBC 2.5*  --   HGB  12.9* 14.3  HCT 38.2* 42.0  MCV 86.0  --   PLT 155  --    Basic Metabolic Panel:  Recent Labs Lab 05/14/16 1054  NA 143  K 3.7  CL 105  GLUCOSE 83  BUN 22*  CREATININE 1.10   GFR: Estimated Creatinine Clearance: 97.3 mL/min (by C-G formula based on SCr of 1.1 mg/dL). Liver Function Tests: No results for input(s): AST, ALT, ALKPHOS, BILITOT, PROT, ALBUMIN in the last 168 hours. No results for input(s): LIPASE, AMYLASE in the last 168 hours. No results for input(s): AMMONIA in the last 168 hours. Coagulation Profile: No results for input(s): INR, PROTIME in the last 168 hours. Cardiac Enzymes: No results for input(s): CKTOTAL, CKMB, CKMBINDEX, TROPONINI in the last 168 hours. BNP (last 3 results) No results for input(s): PROBNP in the last 8760 hours. HbA1C: No results for input(s): HGBA1C in the last 72 hours. CBG:  Recent Labs Lab 05/14/16 1015  GLUCAP 85   Lipid Profile: No results for input(s): CHOL, HDL, LDLCALC, TRIG, CHOLHDL, LDLDIRECT in the last 72 hours. Thyroid Function Tests: No results for input(s): TSH, T4TOTAL, FREET4, T3FREE, THYROIDAB in the last 72 hours. Anemia Panel: No results for input(s): VITAMINB12, FOLATE, FERRITIN, TIBC, IRON, RETICCTPCT in the last 72 hours. Urine analysis:    Component Value Date/Time   COLORURINE YELLOW 02/15/2016 0323   APPEARANCEUR HAZY (A) 02/15/2016 0323   LABSPEC 1.014 02/15/2016 0323   PHURINE 5.0 02/15/2016 0323   GLUCOSEU NEGATIVE 02/15/2016 0323   HGBUR NEGATIVE 02/15/2016 0323   BILIRUBINUR NEGATIVE 02/15/2016 0323   KETONESUR NEGATIVE 02/15/2016 0323   PROTEINUR NEGATIVE 02/15/2016 0323   NITRITE NEGATIVE 02/15/2016 0323   LEUKOCYTESUR NEGATIVE 02/15/2016 0323   Sepsis Labs: @LABRCNTIP (procalcitonin:4,lacticidven:4) )No results found for this or any previous visit (from the past 240 hour(s)).   Radiological Exams on Admission: No results found.  EKG: Independently reviewed. Sinus  rhythm  Assessment/Plan Active Problems:   Cocaine use   Syncope    Syncope - Admit patient to telemetry observation, unclear etiology, he does appear to have some periods of confusion as today he was under the impression that he was walking away from the hospital and ended up back at the hospital, unclear story however see is somewhat of a poor historian, however rule out intracranial abnormalities, obtain a CT scan of the brain, rule out seizure disorder, obtain an EEG - Given reports of occasional chest pain and cocaine abuse, he may have a cardiomyopathy and will obtain a 2-D echo  Cocaine abuse - Strongly counseled for cessation, discussed at length that it may be a relationship between he did cocaine and his syncopal episodes   DVT prophylaxis: Lovenox  Code Status: Full  Family Communication: no family bedside Disposition Plan: admit to telemetry Consults called: none  Admission status: obs    Pamella Pertostin Gherghe, MD [facility] Pager [phone]  If 7PM-7AM, please contact night-coverage [email or web address] Password [email or web address]  05/14/2016, 11:47 AM

## 2016-05-14 NOTE — ED Notes (Signed)
PT CAN GO TO FLOOR AT 12:15.

## 2016-05-14 NOTE — ED Provider Notes (Signed)
[facility]-EMERGENCY DEPT Provider Note   CSN: [id number] Arrival date & time: 05/14/16  0015 By signing my name below, I, Levon Hedger, attest that this documentation has been prepared under the direction and in the presence of Angelea Penny, MD . Electronically Signed: Levon Hedger, Scribe. 05/14/2016. 2:36 AM.   History   Chief Complaint Chief Complaint  Patient presents with  . Back Pain    HPI Shafin Pollio is a 40 y.o. male who presents to the Emergency Department complaining of upper back pain onset one week ago. No treatments tried. No alleviating or modifying factors noted.  Pt was seen for the same on 05/12/16 and had negative thoracic x-ray. He has no other complaints at this time.  The history is provided by the patient. No language interpreter was used.  Back Pain   This is a new problem. The current episode started more than 1 week ago. The problem occurs constantly. The problem has not changed since onset.The pain is associated with no known injury. Pain location: right shoulder. The pain does not radiate. The pain is moderate. The pain is the same all the time. Pertinent negatives include no fever, no numbness and no weakness. He has tried nothing for the symptoms. The treatment provided no relief. Risk factors: unknown.   History reviewed. No pertinent past medical history.  Patient Active Problem List   Diagnosis Date Noted  . Acute kidney failure (HCC) 02/15/2016  . Dehydration 02/15/2016  . Muscle cramps 02/15/2016  . Cocaine use 02/15/2016    History reviewed. No pertinent surgical history.  Home Medications    Prior to Admission medications   Not on File    Family History History reviewed. No pertinent family history.  Social History Social History  Substance Use Topics  . Smoking status: Current Every Day Smoker  . Smokeless tobacco: Never Used  . Alcohol use Yes     Comment: Socailly      Allergies   Review of patient's allergies indicates no  known allergies.   Review of Systems Review of Systems  Constitutional: Negative for fever.  Musculoskeletal: Positive for back pain.  Neurological: Negative for weakness and numbness.  All other systems reviewed and are negative.  Physical Exam Updated Vital Signs BP 117/73 (BP Location: Right Arm)   Pulse 80   Temp 98.1 F (36.7 C) (Oral)   Resp 18   Ht 6' (1.829 m)   Wt 170 lb (77.1 kg)   SpO2 100%   BMI 23.06 kg/m   Physical Exam  Constitutional: He is oriented to person, place, and time. He appears well-developed and well-nourished. No distress.  HENT:  Head: Normocephalic and atraumatic.  Mouth/Throat: Oropharynx is clear and moist. No oropharyngeal exudate.  Moist mucous membranes   Eyes: Conjunctivae are normal. Pupils are equal, round, and reactive to light.  Neck: No JVD present.  Trachea midline No bruit No step off or crepitance of the c, t, l, or s spine. Spasm in the right trapezius   Cardiovascular: Normal rate, regular rhythm and normal heart sounds.   Pulmonary/Chest: Effort normal and breath sounds normal. No stridor. No respiratory distress.  Abdominal: Soft. Bowel sounds are normal. He exhibits no distension.  Neurological: He is alert and oriented to person, place, and time. He has normal reflexes.  Skin: Skin is warm and dry.  Psychiatric: He has a normal mood and affect. His behavior is normal.  Nursing note and vitals reviewed.  ED Treatments / Results  DIAGNOSTIC STUDIES:  Oxygen Saturation is 100% on RA, normal by my interpretation.    COORDINATION OF CARE:  2:35 AM Discussed treatment plan with pt at bedside and pt agreed to plan.   Labs (all labs ordered are listed, but only abnormal results are displayed) Labs Reviewed - No data to display  EKG  EKG Interpretation None       Radiology Dg Thoracic Spine 2 View  Result Date: 05/12/2016 CLINICAL DATA:  Acute onset of upper back pain.  Initial encounter. EXAM: THORACIC SPINE 2  VIEWS COMPARISON:  None. FINDINGS: There is no evidence of fracture or subluxation. Vertebral bodies demonstrate normal height and alignment. Intervertebral disc spaces are preserved. The visualized portions of both lungs are clear. The mediastinum is unremarkable in appearance. IMPRESSION: No evidence of fracture or subluxation along the thoracic spine. Electronically Signed   By: Roanna RaiderJeffery  Chang M.D.   On: 05/12/2016 03:42    Procedures Procedures (including critical care time)  Medications Ordered in ED Medications - No data to display   Initial Impression / Assessment and Plan / ED Course  I have reviewed the triage vital signs and the nursing notes.  Pertinent labs & imaging results that were available during my care of the patient were reviewed by me and considered in my medical decision making (see chart for details).  Vitals:   05/14/16 0035  BP: 117/73  Pulse: 80  Resp: 18  Temp: 98.1 F (36.7 C)   Medications  naproxen (NAPROSYN) tablet 500 mg (not administered)  acetaminophen (TYLENOL) tablet 1,000 mg (not administered)  methocarbamol (ROBAXIN) tablet 1,000 mg (not administered)     Final Clinical Impressions(s) / ED Diagnoses   Final diagnoses:  None  NSAIDs and muscle relaxants.   All questions answered to patient's satisfaction. Based on history and exam patient has been appropriately medically screened and emergency conditions excluded. Patient is stable for discharge at this time. Follow up with your PMD for recheck in 2 days and strict return precautions given  I personally performed the services described in this documentation, which was scribed in my presence. The recorded information has been reviewed and is accurate.    New Prescriptions New Prescriptions   No medications on file     Thelonious Kauffmann, MD 05/14/16 [phone]

## 2016-05-14 NOTE — ED Triage Notes (Signed)
Pt complains of upper back pain and side cramps

## 2016-05-14 NOTE — ED Notes (Signed)
Cannot find pt immediately after checking into dept.

## 2016-05-14 NOTE — ED Notes (Signed)
Pt had gone outside to smoke cig.  Brought patient back to traige upon return.

## 2016-05-15 ENCOUNTER — Observation Stay (HOSPITAL_BASED_OUTPATIENT_CLINIC_OR_DEPARTMENT_OTHER)
Admit: 2016-05-15 | Discharge: 2016-05-15 | Disposition: A | Discharge status: Home / Self Care | Payer: Medicaid Other | Attending: Neurology | Admitting: Neurology

## 2016-05-15 DIAGNOSIS — R55 Syncope and collapse: Secondary | ICD-10-CM

## 2016-05-15 DIAGNOSIS — F1410 Cocaine abuse, uncomplicated: Secondary | ICD-10-CM

## 2016-05-15 LAB — TSH: TSH: 1.531 u[IU]/mL (ref 0.350–4.500)

## 2016-05-15 NOTE — Procedures (Signed)
ELECTROENCEPHALOGRAM REPORT  Date of Study: 05/15/2016  Patient's Name: Wesley Blair Obey MRN: [id number] Date of Birth: 1975-11-09  Referring Provider: Pamella Pertostin Gherghe, MD  Clinical History: 40 y.o. male without significant medical history, presents to the emergency room with complaints of syncopal episodes.  Medications: Lovenox  Technical Summary: A multichannel digital EEG recording measured by the international 10-20 system with electrodes applied with paste and impedances below 5000 ohms performed in our laboratory with EKG monitoring in an awake patient.  Hyperventilation and photic stimulation were performed.  The digital EEG was referentially recorded, reformatted, and digitally filtered in a variety of bipolar and referential montages for optimal display.    Description: The patient is awake during the recording.  During maximal wakefulness, there is a symmetric, medium voltage 10 Hz posterior dominant rhythm that attenuates with eye opening.  The record is symmetric.  Hyperventilation and photic stimulation did not elicit any abnormalities.  There were no epileptiform discharges or electrographic seizures seen.    EKG lead was unremarkable.  Impression: This awake EEG is normal.    Clinical Correlation: A normal EEG does not exclude a clinical diagnosis of epilepsy.  If further clinical questions remain, prolonged EEG may be helpful.  Clinical correlation is advised.   Shon MilletAdam Jaffe, DO

## 2016-05-15 NOTE — Progress Notes (Signed)
Patient ID: Wesley Blair, male   DOB: [birth year], 40 y.o.   MRN: [id number]   PROGRESS NOTE    Wesley Blair  WUJ:[id number] DOB: [birth year] DOA: 05/14/2016  PCP: No PCP Per Patient   Brief Narrative:  40 -year-old male with past medical history of substance abuse who [facility] for evaluation of syncopal event prior to the admission. Patient apparently has had multiple similar events over last couple of days and even 2 months.  Patient was hemodynamic stable on the admission. His drug screen is positive for cocaine. He was admitted for further evaluation of syncope.  Assessment & Plan:   Active Problems:  Syncope - Likely secondary to cocaine abuse - No evidence of seizures on EEG - 2-D echo within normal limit - Awaiting physical therapy evaluation    Cocaine use - Counseled on drug abuse   DVT prophylaxis: Lovenox subcutaneous Code Status: full code  Family Communication: No family at the bedside Disposition Plan: Needs physical therapy evaluation for safe discharge plan   Consultants:   PT  Procedures:   EEG - Normal EEG  2-D echo - ejection fraction 60%  Antimicrobials:   None    Subjective: Says he still feels lightheaded and dizzy.  Objective: Vitals:   05/14/16 1200 05/14/16 1230 05/14/16 2050 05/15/16 0531  BP: 122/92 (!) 143/94 134/71 131/84  Pulse:  (!) 59 73 93  Resp: 18 16 16 16   Temp:  97.8 F (36.6 C) 98.7 F (37.1 C) 98.3 F (36.8 C)  TempSrc:  Oral Oral Oral  SpO2:  100% 100% 100%  Weight:  77.1 kg (170 lb)    Height:  6' (1.829 m)      Intake/Output Summary (Last 24 hours) at 05/15/16 1214 Last data filed at 05/15/16 0305  Gross per 24 hour  Intake              720 ml  Output                0 ml  Net              720 ml   Filed Weights   05/14/16 1230  Weight: 77.1 kg (170 lb)    Examination:  General exam: Appears calm and comfortable  Respiratory system: Clear to auscultation. Respiratory  effort normal. Cardiovascular system: S1 & S2 heard, RRR. No JVD, murmurs, rubs, gallops or clicks. No pedal edema. Gastrointestinal system: Abdomen is nondistended, soft and nontender. No organomegaly or masses felt. Normal bowel sounds heard. Central nervous system: Alert and oriented. No focal neurological deficits. Extremities: Symmetric 5 x 5 power. Skin: No rashes, lesions or ulcers Psychiatry: Judgement and insight appear normal. Mood & affect appropriate.   Data Reviewed: I have personally reviewed following labs and imaging studies  CBC:  Recent Labs Lab 05/14/16 1033 05/14/16 1054  WBC 2.5*  --   HGB 12.9* 14.3  HCT 38.2* 42.0  MCV 86.0  --   PLT 155  --    Basic Metabolic Panel:  Recent Labs Lab 05/14/16 1054 05/14/16 1144  NA 143 139  K 3.7 3.8  CL 105 109  CO2  --  22  GLUCOSE 83 75  BUN 22* 22*  CREATININE 1.10 1.00  CALCIUM  --  9.1   GFR: Estimated Creatinine Clearance: 107.1 mL/min (by C-G formula based on SCr of 1 mg/dL). Liver Function Tests: No results for input(s): AST, ALT, ALKPHOS, BILITOT, PROT, ALBUMIN in the last 168 hours.  No results for input(s): LIPASE, AMYLASE in the last 168 hours. No results for input(s): AMMONIA in the last 168 hours. Coagulation Profile: No results for input(s): INR, PROTIME in the last 168 hours. Cardiac Enzymes: No results for input(s): CKTOTAL, CKMB, CKMBINDEX, TROPONINI in the last 168 hours. BNP (last 3 results) No results for input(s): PROBNP in the last 8760 hours. HbA1C: No results for input(s): HGBA1C in the last 72 hours. CBG:  Recent Labs Lab 05/14/16 1015  GLUCAP 85   Lipid Profile: No results for input(s): CHOL, HDL, LDLCALC, TRIG, CHOLHDL, LDLDIRECT in the last 72 hours. Thyroid Function Tests:  Recent Labs  05/15/16 0535  TSH 1.531   Anemia Panel: No results for input(s): VITAMINB12, FOLATE, FERRITIN, TIBC, IRON, RETICCTPCT in the last 72 hours. Urine analysis:    Component Value  Date/Time   COLORURINE YELLOW 05/14/2016 1214   APPEARANCEUR CLEAR 05/14/2016 1214   LABSPEC 1.036 (H) 05/14/2016 1214   PHURINE 5.5 05/14/2016 1214   GLUCOSEU NEGATIVE 05/14/2016 1214   HGBUR NEGATIVE 05/14/2016 1214   BILIRUBINUR SMALL (A) 05/14/2016 1214   KETONESUR NEGATIVE 05/14/2016 1214   PROTEINUR NEGATIVE 05/14/2016 1214   NITRITE NEGATIVE 05/14/2016 1214   LEUKOCYTESUR NEGATIVE 05/14/2016 1214   Sepsis Labs: @LABRCNTIP (procalcitonin:4,lacticidven:4)   )No results found for this or any previous visit (from the past 240 hour(s)).    Radiology Studies: Dg Thoracic Spine 2 View Result Date: 05/12/2016  No evidence of fracture or subluxation along the thoracic spine. Electronically Signed   By: Roanna Raider M.D.   On: 05/12/2016 03:42   Ct Head Wo Contrast Result Date: 05/14/2016  Normal unenhanced CT scan of the brain. Electronically Signed   By: Amie Portland M.D.   On: 05/14/2016 12:19      Scheduled Meds: . enoxaparin (LOVENOX) injection  40 mg Subcutaneous Q24H   Continuous Infusions:    LOS: 0 days    Time spent: 15 minutes  Greater than 50% of the time spent on counseling and coordinating the care.   Manson Passey, MD [facility] Pager [phone]  If 7PM-7AM, please contact night-coverage [email or web address] Password [facility] 05/15/2016, 12:14 PM

## 2016-05-15 NOTE — Progress Notes (Signed)
EEG Completed; Results Pending  

## 2016-05-16 DIAGNOSIS — F1410 Cocaine abuse, uncomplicated: Secondary | ICD-10-CM | POA: Diagnosis not present

## 2016-05-16 DIAGNOSIS — R55 Syncope and collapse: Secondary | ICD-10-CM | POA: Diagnosis not present

## 2016-05-16 NOTE — Discharge Instructions (Addendum)

## 2016-05-16 NOTE — Progress Notes (Signed)
Patient removed his telemetry leads and pulled out his IV. Refused to allow this RN to assess IV site. Stated, "I'm good". Discharge paperwork (AVS) reviewed with and given to patient. Patient stated, "No" when asked if he had any questions. Patient ambulated to elevator without difficulty.

## 2016-05-16 NOTE — Discharge Summary (Signed)
Physician Discharge Summary  Wesley Blair ZOX:[id number] DOB: 11-28-1975 DOA: 05/14/2016  PCP: No PCP Per Patient  Admit date: 05/14/2016 Discharge date: 05/16/2016  Recommendations for Outpatient Follow-up:  1. No new medications on discharge   Discharge Diagnoses:  Active Problems:   Cocaine use   Syncope    Discharge Condition: stable   Diet recommendation: as tolerated   History of present illness:  40 -year-old male with past medical history of substance abuse who presented to [facility] for evaluation of syncopal event prior to the admission. Patient apparently has had multiple similar events over last couple of days and even 2 months.  Patient was hemodynamic stable on the admission. His drug screen is positive for cocaine. He was admitted for further evaluation of syncope.  Hospital Course:    Assessment & Plan:   Active Problems:  Syncope - Likely secondary to cocaine abuse - No evidence of seizures on EEG - 2-D echo within normal limit - Feels better this am, did not want to wait for PT eval but he is ambulating fine this am without c/o lightheadedness or dizziness    Cocaine use - Counseled on drug abuse   DVT prophylaxis: Lovenox subcutaneous Code Status: full code  Family Communication: No family at the bedside    Consultants:   PT  Procedures:   EEG - Normal EEG  2-D echo - ejection fraction 60%  Antimicrobials:   None    Signed:  Manson Passey, MD  [facility] 05/16/2016, 11:21 AM  Pager #: [phone]  Time spent in minutes: less than 30 minutes   Discharge Exam: Vitals:   05/15/16 2211 05/16/16 0644  BP: 124/77 121/73  Pulse: 73 75  Resp: 20 18  Temp: 98.3 F (36.8 C) 98.2 F (36.8 C)   Vitals:   05/15/16 0531 05/15/16 1236 05/15/16 2211 05/16/16 0644  BP: 131/84 122/69 124/77 121/73  Pulse: 93 72 73 75  Resp: 16 18 20 18   Temp: 98.3 F (36.8 C) 98.2 F (36.8 C) 98.3 F (36.8 C) 98.2 F  (36.8 C)  TempSrc: Oral Oral Oral Oral  SpO2: 100% 100% 100% 100%  Weight:      Height:        General: Pt is alert, follows commands appropriately, not in acute distress Cardiovascular: Regular rate and rhythm, S1/S2 +, no murmurs Respiratory: Clear to auscultation bilaterally, no wheezing, no crackles, no rhonchi Abdominal: Soft, non tender, non distended, bowel sounds +, no guarding Extremities: no edema, no cyanosis, pulses palpable bilaterally DP and PT Neuro: Grossly nonfocal  Discharge Instructions  Discharge Instructions    Call MD for:  difficulty breathing, headache or visual disturbances    Complete by:  As directed    Call MD for:  hives    Complete by:  As directed    Call MD for:  persistant nausea and vomiting    Complete by:  As directed    Call MD for:  severe uncontrolled pain    Complete by:  As directed    Diet - low sodium heart healthy    Complete by:  As directed    Increase activity slowly    Complete by:  As directed        Medication List    TAKE these medications   methocarbamol 500 MG tablet Commonly known as:  ROBAXIN Take 1 tablet (500 mg total) by mouth 2 (two) times daily.   naproxen 375 MG tablet Commonly known as:  NAPROSYN  Take 1 tablet (375 mg total) by mouth 2 (two) times daily.         The results of significant diagnostics from this hospitalization (including imaging, microbiology, ancillary and laboratory) are listed below for reference.    Significant Diagnostic Studies: Dg Thoracic Spine 2 View  Result Date: 05/12/2016 CLINICAL DATA:  Acute onset of upper back pain.  Initial encounter. EXAM: THORACIC SPINE 2 VIEWS COMPARISON:  None. FINDINGS: There is no evidence of fracture or subluxation. Vertebral bodies demonstrate normal height and alignment. Intervertebral disc spaces are preserved. The visualized portions of both lungs are clear. The mediastinum is unremarkable in appearance. IMPRESSION: No evidence of fracture or  subluxation along the thoracic spine. Electronically Signed   By: Roanna RaiderJeffery  Chang M.D.   On: 05/12/2016 03:42   Ct Head Wo Contrast  Result Date: 05/14/2016 CLINICAL DATA:  40 y.o. male without significant medical history, presents to the emergency room with complaints of syncopal episodes. EXAM: CT HEAD WITHOUT CONTRAST TECHNIQUE: Contiguous axial images were obtained from the base of the skull through the vertex without intravenous contrast. COMPARISON:  None. FINDINGS: Brain: No evidence of acute infarction, hemorrhage, hydrocephalus, extra-axial collection or mass lesion/mass effect. Vascular: No hyperdense vessel or unexpected calcification. Skull: Normal. Negative for fracture or focal lesion. Sinuses/Orbits: No acute finding. Other: None. IMPRESSION: Normal unenhanced CT scan of the brain. Electronically Signed   By: Amie Portlandavid  Ormond M.D.   On: 05/14/2016 12:19    Microbiology: No results found for this or any previous visit (from the past 240 hour(s)).   Labs: Basic Metabolic Panel:  Recent Labs Lab 05/14/16 1054 05/14/16 1144  NA 143 139  K 3.7 3.8  CL 105 109  CO2  --  22  GLUCOSE 83 75  BUN 22* 22*  CREATININE 1.10 1.00  CALCIUM  --  9.1   Liver Function Tests: No results for input(s): AST, ALT, ALKPHOS, BILITOT, PROT, ALBUMIN in the last 168 hours. No results for input(s): LIPASE, AMYLASE in the last 168 hours. No results for input(s): AMMONIA in the last 168 hours. CBC:  Recent Labs Lab 05/14/16 1033 05/14/16 1054  WBC 2.5*  --   HGB 12.9* 14.3  HCT 38.2* 42.0  MCV 86.0  --   PLT 155  --    Cardiac Enzymes: No results for input(s): CKTOTAL, CKMB, CKMBINDEX, TROPONINI in the last 168 hours. BNP: BNP (last 3 results) No results for input(s): BNP in the last 8760 hours.  ProBNP (last 3 results) No results for input(s): PROBNP in the last 8760 hours.  CBG:  Recent Labs Lab 05/14/16 1015  GLUCAP 85

## 2016-05-22 ENCOUNTER — Emergency Department (HOSPITAL_COMMUNITY)
Admission: EM | Admit: 2016-05-22 | Discharge: 2016-05-22 | Disposition: A | Discharge status: Home / Self Care | Payer: Medicaid Other | Attending: Emergency Medicine | Admitting: Emergency Medicine

## 2016-05-22 ENCOUNTER — Encounter (HOSPITAL_COMMUNITY): Payer: Self-pay

## 2016-05-22 DIAGNOSIS — Z79899 Other long term (current) drug therapy: Secondary | ICD-10-CM | POA: Diagnosis not present

## 2016-05-22 DIAGNOSIS — F1410 Cocaine abuse, uncomplicated: Secondary | ICD-10-CM | POA: Diagnosis not present

## 2016-05-22 DIAGNOSIS — F17200 Nicotine dependence, unspecified, uncomplicated: Secondary | ICD-10-CM | POA: Insufficient documentation

## 2016-05-22 DIAGNOSIS — R413 Other amnesia: Secondary | ICD-10-CM | POA: Diagnosis present

## 2016-05-22 LAB — RAPID URINE DRUG SCREEN, HOSP PERFORMED
Amphetamines: NOT DETECTED
BARBITURATES: NOT DETECTED
BENZODIAZEPINES: NOT DETECTED
COCAINE: POSITIVE — AB
OPIATES: NOT DETECTED
TETRAHYDROCANNABINOL: NOT DETECTED

## 2016-05-22 LAB — I-STAT CHEM 8, ED
BUN: 25 mg/dL — AB (ref 6–20)
CALCIUM ION: 1.14 mmol/L — AB (ref 1.15–1.40)
CHLORIDE: 105 mmol/L (ref 101–111)
Creatinine, Ser: 1.2 mg/dL (ref 0.61–1.24)
Glucose, Bld: 98 mg/dL (ref 65–99)
HEMATOCRIT: 36 % — AB (ref 39.0–52.0)
Hemoglobin: 12.2 g/dL — ABNORMAL LOW (ref 13.0–17.0)
Potassium: 4.1 mmol/L (ref 3.5–5.1)
SODIUM: 139 mmol/L (ref 135–145)
TCO2: 28 mmol/L (ref 0–100)

## 2016-05-22 LAB — I-STAT TROPONIN, ED: Troponin i, poc: 0 ng/mL (ref 0.00–0.08)

## 2016-05-22 NOTE — ED Triage Notes (Signed)
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 He was admitted last week for unknown syncopal episodes

## 2016-05-22 NOTE — ED Provider Notes (Signed)
[facility]-EMERGENCY DEPT Provider Note   CSN: [id number] Arrival date & time: 05/22/16  0053     History   Chief Complaint Chief Complaint  Patient presents with  . Memory Loss    HPI Wesley Blair is a 40 y.o. male.  40 year old male with a history of syncope, acute kidney failure, and cocaine abuse presents to the emergency department for evaluation of alleged syncopal event. 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 He states that he is unaware of how he got there. He does report drinking a beer today. He denies cocaine abuse since his last admission. He was admitted on 05/14/2016 for similar episodes. During this admission, patient had a normal EEG and echocardiogram. He was found to be positive for cocaine. Patient appears preoccupied with sleeping at this time.   The history is provided by the patient. No language interpreter was used.    History reviewed. No pertinent past medical history.  Patient Active Problem List   Diagnosis Date Noted  . Syncope 05/14/2016  . Acute kidney failure (HCC) 02/15/2016  . Dehydration 02/15/2016  . Muscle cramps 02/15/2016  . Cocaine use 02/15/2016    History reviewed. No pertinent surgical history.     Home Medications    Prior to Admission medications   Medication Sig Start Date End Date Taking? Authorizing Provider  methocarbamol (ROBAXIN) 500 MG tablet Take 1 tablet (500 mg total) by mouth 2 (two) times daily. Patient not taking: Reported on 05/22/2016 05/14/16   April Palumbo, MD  naproxen (NAPROSYN) 375 MG tablet Take 1 tablet (375 mg total) by mouth 2 (two) times daily. Patient not taking: Reported on 05/22/2016 05/14/16   April Palumbo, MD    Family History History reviewed. No pertinent family history.  Social History Social History  Substance Use Topics  . Smoking status: Current Every Day Smoker  . Smokeless tobacco: Never Used  . Alcohol use Yes   Comment: Socailly      Allergies   Review of patient's allergies indicates no known allergies.   Review of Systems Review of Systems Ten systems reviewed and are negative for acute change, except as noted in the HPI.    Physical Exam Updated Vital Signs BP 129/90 (BP Location: Right Arm)   Pulse 99   Temp 98.5 F (36.9 C) (Oral)   Resp 20   SpO2 99%   Physical Exam  Constitutional: He is oriented to person, place, and time. He appears well-developed and well-nourished. No distress.  Nontoxic appearing in no distress.  HENT:  Head: Normocephalic and atraumatic.  Eyes: Conjunctivae and EOM are normal. No scleral icterus.  Neck: Normal range of motion.  Cardiovascular: Normal rate, regular rhythm and intact distal pulses.   Pulmonary/Chest: Effort normal. No respiratory distress. He has no wheezes.  Respirations even and unlabored  Musculoskeletal: Normal range of motion.  Neurological: He is alert and oriented to person, place, and time. He exhibits normal muscle tone. Coordination normal.  GCS 15. Speech is goal oriented. Patient moving all extremities. No focal deficits noted.  Skin: Skin is warm and dry. No rash noted. He is not diaphoretic. No erythema. No pallor.  Psychiatric: He has a normal mood and affect. His behavior is normal.  Nursing note and vitals reviewed.    ED Treatments / Results  Labs (all labs ordered are listed, but only abnormal results are displayed) Labs Reviewed  URINE RAPID DRUG SCREEN, HOSP  PERFORMED - Abnormal; Notable for the following:       Result Value   Cocaine POSITIVE (*)    All other components within normal limits  I-STAT CHEM 8, ED - Abnormal; Notable for the following:    BUN 25 (*)    Calcium, Ion 1.14 (*)    Hemoglobin 12.2 (*)    HCT 36.0 (*)    All other components within normal limits  I-STAT TROPOININ, ED    EKG  EKG Interpretation  Date/Time:  Wednesday May 22 2016 03:57:50 EDT Ventricular Rate:  84 PR  Interval:    QRS Duration: 88 QT Interval:  377 QTC Calculation: 446 R Axis:   -61 Text Interpretation:  Sinus rhythm Left anterior fascicular block Abnormal R-wave progression, late transition ST elev, probable normal early repol pattern no change Confirmed by [facility]  MD, APRIL ([id number]) on 05/22/2016 4:05:02 AM       Radiology Ct Head Wo Contrast  Result Date: 05/14/2016 CLINICAL DATA:  40 y.o. male without significant medical history, presents to the emergency room with complaints of syncopal episodes. EXAM: CT HEAD WITHOUT CONTRAST TECHNIQUE: Contiguous axial images were obtained from the base of the skull through the vertex without intravenous contrast. COMPARISON:  None. FINDINGS: Brain: No evidence of acute infarction, hemorrhage, hydrocephalus, extra-axial collection or mass lesion/mass effect. Vascular: No hyperdense vessel or unexpected calcification. Skull: Normal. Negative for fracture or focal lesion. Sinuses/Orbits: No acute finding. Other: None. IMPRESSION: Normal unenhanced CT scan of the brain. Electronically Signed   By: Amie Portland M.D.   On: 05/14/2016 12:19    Procedures Procedures (including critical care time)  Medications Ordered in ED Medications - No data to display   Initial Impression / Assessment and Plan / ED Course  I have reviewed the triage vital signs and the nursing notes.  Pertinent labs & imaging results that were available during my care of the patient were reviewed by me and considered in my medical decision making (see chart for details).  Clinical Course    40 year old male presents to the emergency department reporting an episode of memory loss. Patient was admitted for a similar episode earlier this week. He had a normal EEG and reassuring echocardiogram. Patient also with recent negative head CT. Chemistry panel is at baseline. No worsening anemia. Patient with stable EKG and negative troponin. He is, again, positive for cocaine.  Suspect that symptoms are substance-induced. I do not believe further emergent workup is indicated. Question malingering as patient is homeless. Patient discharged in stable condition.   Final Clinical Impressions(s) / ED Diagnoses   Final diagnoses:  Cocaine abuse    New Prescriptions New Prescriptions   No medications on file     Antony Madura, PA-C 05/22/16 0522    April Palumbo, MD 05/22/16 2304

## 2016-05-22 NOTE — ED Notes (Signed)
Pt called from lobby with no answer. 

## 2016-05-29 ENCOUNTER — Emergency Department (HOSPITAL_COMMUNITY)
Admission: EM | Admit: 2016-05-29 | Discharge: 2016-05-29 | Disposition: A | Discharge status: Home / Self Care | Payer: Medicaid Other | Attending: Dermatology | Admitting: Dermatology

## 2016-05-29 ENCOUNTER — Encounter (HOSPITAL_COMMUNITY): Payer: Self-pay | Admitting: Emergency Medicine

## 2016-05-29 DIAGNOSIS — Z5321 Procedure and treatment not carried out due to patient leaving prior to being seen by health care provider: Secondary | ICD-10-CM | POA: Diagnosis not present

## 2016-05-29 DIAGNOSIS — F17200 Nicotine dependence, unspecified, uncomplicated: Secondary | ICD-10-CM | POA: Insufficient documentation

## 2016-05-29 DIAGNOSIS — Z79899 Other long term (current) drug therapy: Secondary | ICD-10-CM | POA: Diagnosis not present

## 2016-05-29 DIAGNOSIS — R55 Syncope and collapse: Secondary | ICD-10-CM | POA: Diagnosis not present

## 2016-05-29 NOTE — ED Triage Notes (Addendum)
Pt picked by [employer], was found unconscious in road, per [employer] per pt this is recurrent syncopal episode x2 in 2 weeks. Denies ETOH intake . Per [employer] pt was arousal without difficulty at the scene. Pt alert and oriented x 4. Pt is voluntary

## 2016-05-29 NOTE — ED Notes (Signed)
Pt decided that he would rather go outside to smoke than be seen.

## 2016-05-29 NOTE — ED Notes (Signed)
Bed: [facility] Expected date:  Expected time:  Means of arrival:  Comments: 

## 2016-06-02 ENCOUNTER — Emergency Department (HOSPITAL_COMMUNITY)
Admission: EM | Admit: 2016-06-02 | Discharge: 2016-06-02 | Disposition: A | Discharge status: Home / Self Care | Payer: 59 | Attending: Emergency Medicine | Admitting: Emergency Medicine

## 2016-06-02 ENCOUNTER — Encounter (HOSPITAL_COMMUNITY): Payer: Self-pay

## 2016-06-02 DIAGNOSIS — Z79899 Other long term (current) drug therapy: Secondary | ICD-10-CM | POA: Insufficient documentation

## 2016-06-02 DIAGNOSIS — F17200 Nicotine dependence, unspecified, uncomplicated: Secondary | ICD-10-CM | POA: Insufficient documentation

## 2016-06-02 DIAGNOSIS — R413 Other amnesia: Secondary | ICD-10-CM

## 2016-06-02 LAB — BASIC METABOLIC PANEL
ANION GAP: 6 (ref 5–15)
BUN: 16 mg/dL (ref 6–20)
CO2: 26 mmol/L (ref 22–32)
Calcium: 9.2 mg/dL (ref 8.9–10.3)
Chloride: 109 mmol/L (ref 101–111)
Creatinine, Ser: 0.93 mg/dL (ref 0.61–1.24)
GLUCOSE: 93 mg/dL (ref 65–99)
POTASSIUM: 4.5 mmol/L (ref 3.5–5.1)
Sodium: 141 mmol/L (ref 135–145)

## 2016-06-02 LAB — CBC WITH DIFFERENTIAL/PLATELET
Basophils Absolute: 0 10*3/uL (ref 0.0–0.1)
Basophils Relative: 1 %
Eosinophils Absolute: 0.1 10*3/uL (ref 0.0–0.7)
Eosinophils Relative: 4 %
HEMATOCRIT: 35.5 % — AB (ref 39.0–52.0)
HEMOGLOBIN: 11.8 g/dL — AB (ref 13.0–17.0)
LYMPHS ABS: 1.1 10*3/uL (ref 0.7–4.0)
Lymphocytes Relative: 43 %
MCH: 28.7 pg (ref 26.0–34.0)
MCHC: 33.2 g/dL (ref 30.0–36.0)
MCV: 86.4 fL (ref 78.0–100.0)
MONOS PCT: 7 %
Monocytes Absolute: 0.2 10*3/uL (ref 0.1–1.0)
NEUTROS ABS: 1.1 10*3/uL — AB (ref 1.7–7.7)
NEUTROS PCT: 45 %
Platelets: 183 10*3/uL (ref 150–400)
RBC: 4.11 MIL/uL — AB (ref 4.22–5.81)
RDW: 13.3 % (ref 11.5–15.5)
WBC: 2.5 10*3/uL — ABNORMAL LOW (ref 4.0–10.5)

## 2016-06-02 NOTE — ED Notes (Signed)
Patient denies pain and is resting comfortably.  

## 2016-06-02 NOTE — ED Provider Notes (Signed)
[facility]-EMERGENCY DEPT Provider Note   CSN: [id number] Arrival date & time: 06/02/16  0022  By signing my name below, I, Majel HomerPeyton Lee, attest that this documentation has been prepared under the direction and in the presence of non-physician practitioner, Wynetta EmeryNicole Mieczyslaw Stamas, PA-C. Electronically Signed: Majel HomerPeyton Lee, Scribe. 06/02/2016. 1:01 AM.  History   Chief Complaint Chief Complaint  Patient presents with  . Memory Loss   The history is provided by the patient. No language interpreter was used.   HPI Comments: Wesley Blair Current is a 40 y.o. male brought in by EMS to the Emergency Department for an evaluation s/p "blacking out" earlier today. Pt reports associated memory [facility] earlier this [facility] at [facility] in which he "would be sitting somewhere and wake up 2-3 hours later with no in between."  Pt has been seen in the ED 4 times within the past 4 weeks and states he visited again tonight because "soemthing still doesn't feel right." He admits to using cocaine but denies drinking EtOH. He also denies chest pain, shortness of breath and any other symptoms now in the ED.  History reviewed. No pertinent past medical history.  Patient Active Problem List   Diagnosis Date Noted  . Syncope 05/14/2016  . Acute kidney failure (HCC) 02/15/2016  . Dehydration 02/15/2016  . Muscle cramps 02/15/2016  . Cocaine use 02/15/2016   History reviewed. No pertinent surgical history.  Home Medications    Prior to Admission medications   Medication Sig Start Date End Date Taking? Authorizing Provider  methocarbamol (ROBAXIN) 500 MG tablet Take 1 tablet (500 mg total) by mouth 2 (two) times daily. Patient not taking: Reported on 05/22/2016 05/14/16   April Palumbo, MD  naproxen (NAPROSYN) 375 MG tablet Take 1 tablet (375 mg total) by mouth 2 (two) times daily. Patient not taking: Reported on 05/22/2016 05/14/16   April Palumbo, MD    Family History No family history on file.  Social History Social  History  Substance Use Topics  . Smoking status: Current Every Day Smoker  . Smokeless tobacco: Never Used  . Alcohol use Yes     Comment: Socailly      Allergies   Review of patient's allergies indicates no known allergies.   Review of Systems Review of Systems 10 systems reviewed and all are negative for acute change except as noted in the HPI.  Physical Exam Updated Vital Signs BP 116/82 (BP Location: Left Arm)   Pulse 91   Temp 98 F (36.7 C) (Oral)   Resp 18   SpO2 100%   Physical Exam  Constitutional: He is oriented to person, place, and time. He appears well-developed and well-nourished. No distress.  HENT:  Head: Normocephalic and atraumatic.  Mouth/Throat: Oropharynx is clear and moist.  Eyes: Conjunctivae and EOM are normal. Pupils are equal, round, and reactive to light.  Neck: Normal range of motion.  Cardiovascular: Normal rate, regular rhythm and intact distal pulses.   Pulmonary/Chest: Effort normal and breath sounds normal.  Abdominal: Soft. There is no tenderness.  Musculoskeletal: Normal range of motion.  Neurological: He is alert and oriented to person, place, and time.  Sleepy but arousable to voice.  II-Visual fields grossly intact. III/IV/VI-Extraocular movements intact.  Pupils reactive bilaterally. V/VII-Smile symmetric, equal eyebrow raise,  facial sensation intact VIII- Hearing grossly intact IX/X-Normal gag XI-bilateral shoulder shrug XII-midline tongue extension Motor: 5/5 bilaterally with normal tone and bulk Cerebellar: Normal finger-to-nose  and normal heel-to-shin test.     Skin: He is  not diaphoretic.  Psychiatric: He has a normal mood and affect.  Nursing note and vitals reviewed.    ED Treatments / Results  Labs (all labs ordered are listed, but only abnormal results are displayed) Labs Reviewed  CBC WITH DIFFERENTIAL/PLATELET - Abnormal; Notable for the following:       Result Value   WBC 2.5 (*)    RBC 4.11 (*)     Hemoglobin 11.8 (*)    HCT 35.5 (*)    Neutro Abs 1.1 (*)    All other components within normal limits  BASIC METABOLIC PANEL  URINE RAPID DRUG SCREEN, HOSP PERFORMED    EKG  EKG Interpretation None       Radiology No results found.  Procedures Procedures (including critical care time)  Medications Ordered in ED Medications - No data to display  DIAGNOSTIC STUDIES:  Oxygen Saturation is 100% on RA, normal by my interpretation.    COORDINATION OF CARE:  1:00 AM Discussed treatment plan with pt at bedside and pt agreed to plan.  Initial Impression / Assessment and Plan / ED Course  I have reviewed the triage vital signs and the nursing notes.  Pertinent labs & imaging results that were available during my care of the patient were reviewed by me and considered in my medical decision making (see chart for details).  Clinical Course    Vitals:   06/02/16 0025 06/02/16 0032  BP:  116/82  Pulse:  91  Resp:  18  Temp:  98 F (36.7 C)  TempSrc:  Oral  SpO2: 99% 100%     Wesley Blair is 40 y.o. male presenting with Recurrent memory issues, states that he lost time again today while [facility] was [facility], he's been seen for this multiple times in the last several months. Nonfocal neurologic exam. He had a negative EEG and negative echo. Recently negative head CT and EKG, patient not reporting any chest pain today. He does endorse recent cocaine use, states he has not been drinking any alcohol.  Blood work at his baseline, would like to get a UDS however patient becomes combative when you wake him from sleep. I don't think this patient has any emergent condition that would require intervention at this time. He gives is likely in the ED before a comfortable place to sleep. Outpatient resources are provided.  Evaluation does not show pathology that would require ongoing emergent intervention or inpatient treatment. Pt is hemodynamically stable and mentating  appropriately. Discussed findings and plan with patient/guardian, who agrees with care plan. All questions answered. Return precautions discussed and outpatient follow up given.    I personally performed the services described in this documentation, which was scribed in my presence. The recorded information has been reviewed and is accurate.   Final Clinical Impressions(s) / ED Diagnoses   Final diagnoses:  Memory loss    New Prescriptions New Prescriptions   No medications on file     Wynetta Emery, PA-C 06/02/16 1610    Gilda Crease, MD 06/02/16 [phone]

## 2016-06-02 NOTE — ED Notes (Signed)
Patient refused vital signs 

## 2016-06-02 NOTE — Discharge Instructions (Signed)
Do not hesitate to return to the emergency room for any new, worsening or concerning symptoms. ° °Please obtain primary care using resource guide below. Let them know that you were seen in the emergency room and that they will need to obtain records for further outpatient management. ° ° °

## 2016-06-02 NOTE — ED Triage Notes (Signed)
Patient bib [facility], called 911 stating can't remember periods of time.  Patient seen here last week for the same.

## 2016-06-02 NOTE — ED Notes (Signed)
Pt refusing in and out cath , refusing to speak to staff, pretending to sleep

## 2016-06-26 ENCOUNTER — Encounter (HOSPITAL_COMMUNITY): Payer: Self-pay | Admitting: Emergency Medicine

## 2016-06-26 ENCOUNTER — Emergency Department (HOSPITAL_COMMUNITY)
Admission: EM | Admit: 2016-06-26 | Discharge: 2016-06-26 | Disposition: A | Discharge status: Home / Self Care | Payer: 59 | Attending: Emergency Medicine | Admitting: Emergency Medicine

## 2016-06-26 DIAGNOSIS — Z79899 Other long term (current) drug therapy: Secondary | ICD-10-CM | POA: Insufficient documentation

## 2016-06-26 DIAGNOSIS — F17200 Nicotine dependence, unspecified, uncomplicated: Secondary | ICD-10-CM | POA: Insufficient documentation

## 2016-06-26 DIAGNOSIS — Z5900 Homelessness unspecified: Secondary | ICD-10-CM

## 2016-06-26 NOTE — Discharge Instructions (Signed)
Please read and follow all provided instructions.  Your diagnoses today include:  1. Homelessness    Tests performed today include: Vital signs. See below for your results today.   Medications prescribed:  Take as prescribed   Home care instructions:  Follow any educational materials contained in this packet.  Follow-up instructions: Please follow-up with your primary care provider for further evaluation of symptoms and treatment   Return instructions:  Please return to the Emergency Department if you do not get better, if you get worse, or new symptoms OR  - Fever (temperature greater than 101.23F)  - Bleeding that does not stop with holding pressure to the area    -Severe pain (please note that you may be more sore the day after your accident)  - Chest Pain  - Difficulty breathing  - Severe nausea or vomiting  - Inability to tolerate food and liquids  - Passing out  - Skin becoming red around your wounds  - Change in mental status (confusion or lethargy)  - New numbness or weakness    Please return if you have any other emergent concerns.  Additional Information:  Your vital signs today were: BP 126/90 (BP Location: Right Arm)    Pulse 85    Temp 97.9 F (36.6 C) (Oral)    Resp 11    Ht 6' (1.829 m)    Wt 77.1 kg    SpO2 100%    BMI 23.06 kg/m  If your blood pressure (BP) was elevated above 135/85 this visit, please have this repeated by your doctor within one month. ---------------
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
## 2016-06-26 NOTE — ED Notes (Signed)
Tyler, PA at bedside 

## 2016-06-26 NOTE — ED Triage Notes (Signed)
Patient of complaining of pain in legs.
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
## 2016-08-15 ENCOUNTER — Emergency Department (HOSPITAL_COMMUNITY)
Admission: EM | Admit: 2016-08-15 | Discharge: 2016-08-15 | Disposition: A | Discharge status: Home / Self Care | Payer: 59 | Attending: Emergency Medicine | Admitting: Emergency Medicine

## 2016-08-15 ENCOUNTER — Encounter (HOSPITAL_COMMUNITY): Payer: Self-pay | Admitting: Emergency Medicine

## 2016-08-15 DIAGNOSIS — Y999 Unspecified external cause status: Secondary | ICD-10-CM | POA: Insufficient documentation

## 2016-08-15 DIAGNOSIS — Y929 Unspecified place or not applicable: Secondary | ICD-10-CM | POA: Insufficient documentation

## 2016-08-15 DIAGNOSIS — F17200 Nicotine dependence, unspecified, uncomplicated: Secondary | ICD-10-CM | POA: Insufficient documentation

## 2016-08-15 DIAGNOSIS — S21151A Open bite of right front wall of thorax without penetration into thoracic cavity, initial encounter: Secondary | ICD-10-CM | POA: Insufficient documentation

## 2016-08-15 DIAGNOSIS — Y939 Activity, unspecified: Secondary | ICD-10-CM | POA: Insufficient documentation

## 2016-08-15 DIAGNOSIS — S0081XA Abrasion of other part of head, initial encounter: Secondary | ICD-10-CM | POA: Insufficient documentation

## 2016-08-15 DIAGNOSIS — Z23 Encounter for immunization: Secondary | ICD-10-CM | POA: Insufficient documentation

## 2016-08-15 DIAGNOSIS — W503XXA Accidental bite by another person, initial encounter: Secondary | ICD-10-CM | POA: Insufficient documentation

## 2016-08-15 DIAGNOSIS — T148XXA Other injury of unspecified body region, initial encounter: Secondary | ICD-10-CM

## 2016-08-15 MED ORDER — IBUPROFEN 600 MG PO TABS: 600.0000 mg | ORAL_TABLET | Freq: Four times a day (QID) | ORAL | 0 refills | Status: DC | PRN

## 2016-08-15 MED ORDER — TETANUS-DIPHTH-ACELL PERTUSSIS 5-2.5-18.5 LF-MCG/0.5 IM SUSP
0.5000 mL | Freq: Once | INTRAMUSCULAR | Status: AC
  Administered 2016-08-15: 0.5 mL via INTRAMUSCULAR
  Filled 2016-08-15: qty 0.5

## 2016-08-15 MED ORDER — AMOXICILLIN-POT CLAVULANATE 875-125 MG PO TABS
1.0000 | ORAL_TABLET | Freq: Once | ORAL | Status: AC
  Administered 2016-08-15: 1 via ORAL
  Filled 2016-08-15: qty 1

## 2016-08-15 MED ORDER — IBUPROFEN 400 MG PO TABS
600.0000 mg | ORAL_TABLET | Freq: Once | ORAL | Status: AC
  Administered 2016-08-15: 600 mg via ORAL
  Filled 2016-08-15: qty 1

## 2016-08-15 MED ORDER — AMOXICILLIN-POT CLAVULANATE 875-125 MG PO TABS: 1.0000 | ORAL_TABLET | Freq: Two times a day (BID) | ORAL | 0 refills | Status: DC
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
## 2016-08-15 NOTE — Discharge Instructions (Signed)
Take antibiotic as prescribed until completed. He may also take ibuprofen as prescribed as an for pain relief or apply ice to affected area for additional relief. I recommend applying a small amount of Neosporin ointment to your facial wound daily. Keep wound clean using Dial intra-arterial soap and water, pat dry. Please follow up with a primary care provider from the Resource Guide provided below in 2-3 days for wound recheck if your symptoms have not improved or have worsened. Please return to the Emergency Department if symptoms worsen or new onset of fever, headache, visual changes, lightheadedness, dizziness, worsening wound, redness, swelling, warmth, drainage, vomiting, unable to keep fluids down.
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
## 2016-09-09 ENCOUNTER — Emergency Department (HOSPITAL_COMMUNITY)
Admission: EM | Admit: 2016-09-09 | Discharge: 2016-09-09 | Disposition: A | Discharge status: Home / Self Care | Payer: 59 | Attending: Physician Assistant | Admitting: Physician Assistant

## 2016-09-09 ENCOUNTER — Encounter (HOSPITAL_COMMUNITY): Payer: Self-pay | Admitting: Oncology

## 2016-09-09 DIAGNOSIS — Z79899 Other long term (current) drug therapy: Secondary | ICD-10-CM | POA: Insufficient documentation

## 2016-09-09 DIAGNOSIS — M545 Low back pain, unspecified: Secondary | ICD-10-CM

## 2016-09-09 DIAGNOSIS — F17200 Nicotine dependence, unspecified, uncomplicated: Secondary | ICD-10-CM | POA: Insufficient documentation

## 2016-09-09 LAB — URINALYSIS, ROUTINE W REFLEX MICROSCOPIC
BILIRUBIN URINE: NEGATIVE
GLUCOSE, UA: NEGATIVE mg/dL
HGB URINE DIPSTICK: NEGATIVE
KETONES UR: NEGATIVE mg/dL
Leukocytes, UA: NEGATIVE
Nitrite: NEGATIVE
PH: 6 (ref 5.0–8.0)
Protein, ur: NEGATIVE mg/dL
SPECIFIC GRAVITY, URINE: 1.01 (ref 1.005–1.030)

## 2016-09-09 MED ORDER — KETOROLAC TROMETHAMINE 30 MG/ML IJ SOLN
30.0000 mg | Freq: Once | INTRAMUSCULAR | Status: AC
  Administered 2016-09-09: 30 mg via INTRAMUSCULAR
  Filled 2016-09-09: qty 1

## 2016-09-09 MED ORDER — CYCLOBENZAPRINE HCL 5 MG PO TABS: 5.0000 mg | ORAL_TABLET | Freq: Three times a day (TID) | ORAL | 0 refills | Status: DC | PRN

## 2016-09-09 MED ORDER — NAPROXEN 500 MG PO TABS: 500.0000 mg | ORAL_TABLET | Freq: Two times a day (BID) | ORAL | 0 refills | Status: DC

## 2016-09-09 MED ORDER — CYCLOBENZAPRINE HCL 10 MG PO TABS
5.0000 mg | ORAL_TABLET | Freq: Once | ORAL | Status: AC
Start: 2016-09-09 — End: 2016-09-09
  Administered 2016-09-09: 5 mg via ORAL
  Filled 2016-09-09: qty 1

## 2016-09-09 NOTE — ED Notes (Signed)
Patient made aware that a urine specimen was ordered.

## 2016-09-09 NOTE — ED Triage Notes (Signed)
[facility].  Pt c/o back pain.  Ambulatory to triage.

## 2016-09-09 NOTE — ED Provider Notes (Signed)
[facility]-EMERGENCY DEPT Provider Note   CSN: [id number] Arrival date & time: 09/09/16  [id number]     History   Chief Complaint Chief Complaint  Patient presents with  . Back Pain    HPI Wesley Blair is a 41 y.o. male.  Wesley SaltsZurich Huneke is a 41 y.o. male presents to ED with complaint of right low back pain onset yesterday evening. Patient describes the pain as constant, sharp, non-radiating in right low back. Pain is worse with movement. Denies any recent trauma, falls, or change in physical activity. No fever, weight loss, night sweats, loss of bowel or bladder control, saddle anesthesia, numbness, weakness, abdominal pain, nausea, vomiting, dysuria, hematuria, or rash. No h/o cancer or IVDU. No treatments tried PTA. No chronic medical conditions.       History reviewed. No pertinent past medical history.  Patient Active Problem List   Diagnosis Date Noted  . Syncope 05/14/2016  . Acute kidney failure (HCC) 02/15/2016  . Dehydration 02/15/2016  . Muscle cramps 02/15/2016  . Cocaine use 02/15/2016    History reviewed. No pertinent surgical history.     Home Medications    Prior to Admission medications   Medication Sig Start Date End Date Taking? Authorizing Provider  cyclobenzaprine (FLEXERIL) 5 MG tablet Take 1 tablet (5 mg total) by mouth 3 (three) times daily as needed. 09/09/16   Lona KettleAshley Laurel Jaedin Regina, PA-C  naproxen (NAPROSYN) 500 MG tablet Take 1 tablet (500 mg total) by mouth 2 (two) times daily. 09/09/16   Lona KettleAshley Laurel Wes Lezotte, PA-C    Family History No family history on file.  Social History Social History  Substance Use Topics  . Smoking status: Current Every Day Smoker  . Smokeless tobacco: Never Used  . Alcohol use Yes     Comment: Socailly      Allergies   Patient has no known allergies.   Review of Systems Review of Systems  Constitutional: Negative for diaphoresis, fever and unexpected weight change.  Gastrointestinal: Negative for abdominal pain,  nausea and vomiting.  Genitourinary: Negative for dysuria and hematuria.  Musculoskeletal: Positive for back pain.  Skin: Negative for rash.  Allergic/Immunologic: Negative for immunocompromised state.  Neurological: Negative for weakness and numbness.     Physical Exam Updated Vital Signs BP 137/95 (BP Location: Right Arm)   Pulse 81   Temp 97.8 F (36.6 C) (Oral)   Resp 18   Ht 6' (1.829 m)   Wt 77.1 kg   SpO2 100%   BMI 23.06 kg/m   Physical Exam  Constitutional: He appears well-developed and well-nourished. No distress.  HENT:  Head: Normocephalic and atraumatic.  Eyes: Conjunctivae are normal. Pupils are equal, round, and reactive to light. Right eye exhibits no discharge. Left eye exhibits no discharge. No scleral icterus.  Neck: Normal range of motion. Neck supple. No spinous process tenderness present. No neck rigidity. Normal range of motion present.  Cardiovascular: Normal rate, regular rhythm, normal heart sounds and intact distal pulses.   No murmur heard. Pulmonary/Chest: Effort normal and breath sounds normal. No respiratory distress. He has no wheezes.  Abdominal: Soft. Bowel sounds are normal. He exhibits no distension and no pulsatile midline mass. There is no tenderness. There is no guarding and no CVA tenderness.  Musculoskeletal: Normal range of motion. He exhibits no edema.  No midline spinal tenderness. No obvious deformity or step off. No crepitus. TTP of right lumbar paravertebral muscles. ROM intact; however, pain reproducible with forward flexion and right lateral flexion.  Neurological: He is alert. He has normal strength. No sensory deficit. Coordination and gait normal. GCS eye subscore is 4. GCS verbal subscore is 5. GCS motor subscore is 6.  Mental Status:  Alert, thought content appropriate, able to give a coherent history. Speech fluent without evidence of aphasia. Able to follow 2 step commands without difficulty.  Cranial Nerves:  II:   Peripheral visual fields grossly normal, pupils equal, round, reactive to light III,IV, VI: ptosis not present, extra-ocular motions intact bilaterally  V,VII: smile symmetric, facial light touch sensation equal VIII: hearing grossly normal to voice  X: uvula elevates symmetrically  XI: bilateral shoulder shrug symmetric and strong XII: midline tongue extension without fassiculations Motor:  Normal tone. 5/5 in upper and lower extremities bilaterally including strong and equal grip strength and dorsiflexion/plantar flexion Sensory: light touch normal in all extremities. DTRs: patellar 2+ symmetric b/l Gait: normal gait and balance CV: distal pulses palpable throughout  Skin: Skin is warm and dry. He is not diaphoretic.  Psychiatric: He has a normal mood and affect. His behavior is normal.     ED Treatments / Results  Labs (all labs ordered are listed, but only abnormal results are displayed) Labs Reviewed  URINALYSIS, ROUTINE W REFLEX MICROSCOPIC    EKG  EKG Interpretation None       Radiology No results found.  Procedures Procedures (including critical care time)  Medications Ordered in ED Medications  ketorolac (TORADOL) 30 MG/ML injection 30 mg (30 mg Intramuscular Given 09/09/16 0833)  cyclobenzaprine (FLEXERIL) tablet 5 mg (5 mg Oral Given 09/09/16 2440)     Initial Impression / Assessment and Plan / ED Course  I have reviewed the triage vital signs and the nursing notes.  Pertinent labs & imaging results that were available during my care of the patient were reviewed by me and considered in my medical decision making (see chart for details).  Clinical Course     Patient presents to ED with complaint of right low back pain onset last night. Patient is afebrile and non-toxic appearing in NAD. VSS. No midline spinal tenderness, obvious deformity, step off, or crepitus. TTP of right lumbar paravertebral muscles. No CVA tenderness. Heart RRR. Lungs CTABL. Abdomen  soft, non-tender, no pulsatile mass. No neurological deficits and normal neuro exam.  Patient can walk but states is painful.  No loss of bowel or bladder control.  No concern for cauda equina.  No fever, night sweats, weight loss, h/o cancer, IVDU. U/A negative for UTI. Suspect MSK in nature as pain is reproducible with movement.  RICE protocol and pain medicine indicated and discussed with patient. Rx naprosyn and flexeril. Follow up with PCP if sxs persist. Return precautions provided. Pt voiced understanding and is agreeable.     Final Clinical Impressions(s) / ED Diagnoses   Final diagnoses:  Acute right-sided low back pain without sciatica    New Prescriptions New Prescriptions   CYCLOBENZAPRINE (FLEXERIL) 5 MG TABLET    Take 1 tablet (5 mg total) by mouth 3 (three) times daily as needed.   NAPROXEN (NAPROSYN) 500 MG TABLET    Take 1 tablet (500 mg total) by mouth 2 (two) times daily.     Lona Kettle, PA-C 09/09/16 1027    Courteney Randall An, MD 09/11/16 [phone]

## 2016-09-09 NOTE — Discharge Instructions (Signed)
Read the information below.  Your urine did not show any signs of infection. This may be musculoskeletal in nature given pain with range of motion.  I have prescribed naprosyn and flexeril for relief. While taking naprosyn do not take other NSAIDs (ibuprofen, motrin, or aleve). Flexeril can make you drowsy, do not drive after taking.  You can apply heat or ice for 20 minute increments. Perform gentle stretching.  Use the prescribed medication as directed.  Please discuss all new medications with your pharmacist.   Follow up with your primary provider if symptoms persist for more than one week.  You may return to the Emergency Department at any time for worsening condition or any new symptoms that concern you. Return if develop fever, loss of bowel or bladder control, numbness, weakness, or any other new/concerning symptoms.

## 2016-09-15 ENCOUNTER — Emergency Department (HOSPITAL_COMMUNITY): Payer: Self-pay

## 2016-09-15 ENCOUNTER — Encounter (HOSPITAL_COMMUNITY): Payer: Self-pay | Admitting: *Deleted

## 2016-09-15 ENCOUNTER — Emergency Department (HOSPITAL_COMMUNITY)
Admission: EM | Admit: 2016-09-15 | Discharge: 2016-09-15 | Disposition: A | Discharge status: Home / Self Care | Payer: Self-pay | Attending: Emergency Medicine | Admitting: Emergency Medicine

## 2016-09-15 DIAGNOSIS — Y929 Unspecified place or not applicable: Secondary | ICD-10-CM | POA: Insufficient documentation

## 2016-09-15 DIAGNOSIS — R51 Headache: Secondary | ICD-10-CM | POA: Insufficient documentation

## 2016-09-15 DIAGNOSIS — Y9339 Activity, other involving climbing, rappelling and jumping off: Secondary | ICD-10-CM | POA: Insufficient documentation

## 2016-09-15 DIAGNOSIS — W1830XA Fall on same level, unspecified, initial encounter: Secondary | ICD-10-CM | POA: Insufficient documentation

## 2016-09-15 DIAGNOSIS — E860 Dehydration: Secondary | ICD-10-CM

## 2016-09-15 DIAGNOSIS — F17200 Nicotine dependence, unspecified, uncomplicated: Secondary | ICD-10-CM | POA: Insufficient documentation

## 2016-09-15 DIAGNOSIS — S52502A Unspecified fracture of the lower end of left radius, initial encounter for closed fracture: Secondary | ICD-10-CM | POA: Insufficient documentation

## 2016-09-15 DIAGNOSIS — W19XXXA Unspecified fall, initial encounter: Secondary | ICD-10-CM

## 2016-09-15 DIAGNOSIS — Y999 Unspecified external cause status: Secondary | ICD-10-CM | POA: Insufficient documentation

## 2016-09-15 HISTORY — DX: Schizophrenia, unspecified: F20.9

## 2016-09-15 LAB — CBC WITH DIFFERENTIAL/PLATELET
Basophils Absolute: 0 10*3/uL (ref 0.0–0.1)
Basophils Relative: 0 %
EOS ABS: 0 10*3/uL (ref 0.0–0.7)
Eosinophils Relative: 0 %
HEMATOCRIT: 39.1 % (ref 39.0–52.0)
HEMOGLOBIN: 13.1 g/dL (ref 13.0–17.0)
LYMPHS ABS: 1 10*3/uL (ref 0.7–4.0)
Lymphocytes Relative: 11 %
MCH: 27.9 pg (ref 26.0–34.0)
MCHC: 33.5 g/dL (ref 30.0–36.0)
MCV: 83.4 fL (ref 78.0–100.0)
MONOS PCT: 2 %
Monocytes Absolute: 0.2 10*3/uL (ref 0.1–1.0)
NEUTROS ABS: 7.5 10*3/uL (ref 1.7–7.7)
NEUTROS PCT: 87 %
Platelets: 161 10*3/uL (ref 150–400)
RBC: 4.69 MIL/uL (ref 4.22–5.81)
RDW: 14.5 % (ref 11.5–15.5)
WBC: 8.7 10*3/uL (ref 4.0–10.5)

## 2016-09-15 LAB — COMPREHENSIVE METABOLIC PANEL
ALBUMIN: 4.3 g/dL (ref 3.5–5.0)
ALK PHOS: 51 U/L (ref 38–126)
ALT: 24 U/L (ref 17–63)
AST: 45 U/L — ABNORMAL HIGH (ref 15–41)
Anion gap: 11 (ref 5–15)
BILIRUBIN TOTAL: 0.5 mg/dL (ref 0.3–1.2)
BUN: 13 mg/dL (ref 6–20)
CALCIUM: 9.4 mg/dL (ref 8.9–10.3)
CO2: 24 mmol/L (ref 22–32)
CREATININE: 1.21 mg/dL (ref 0.61–1.24)
Chloride: 105 mmol/L (ref 101–111)
GFR calc non Af Amer: >60 mL/min (ref >60)
GLUCOSE: 80 mg/dL (ref 65–99)
Potassium: 3.7 mmol/L (ref 3.5–5.1)
SODIUM: 140 mmol/L (ref 135–145)
TOTAL PROTEIN: 7.6 g/dL (ref 6.5–8.1)

## 2016-09-15 LAB — RAPID URINE DRUG SCREEN, HOSP PERFORMED
AMPHETAMINES: NOT DETECTED
BARBITURATES: NOT DETECTED
BENZODIAZEPINES: NOT DETECTED
Cocaine: POSITIVE — AB
Opiates: NOT DETECTED
Tetrahydrocannabinol: NOT DETECTED

## 2016-09-15 LAB — ETHANOL: Alcohol, Ethyl (B): 7 mg/dL — ABNORMAL HIGH (ref <5)

## 2016-09-15 LAB — SALICYLATE LEVEL: Salicylate Lvl: <7 mg/dL (ref 2.8–30.0)

## 2016-09-15 LAB — ACETAMINOPHEN LEVEL: Acetaminophen (Tylenol), Serum: <10 ug/mL — ABNORMAL LOW (ref 10–30)

## 2016-09-15 MED ORDER — NAPROXEN 500 MG PO TABS: 500.0000 mg | ORAL_TABLET | Freq: Two times a day (BID) | ORAL | 0 refills | Status: DC

## 2016-09-15 MED ORDER — SODIUM CHLORIDE 0.9 % IV BOLUS (SEPSIS)
1000.0000 mL | Freq: Once | INTRAVENOUS | Status: AC
  Administered 2016-09-15: 1000 mL via INTRAVENOUS

## 2016-09-15 MED ORDER — SODIUM CHLORIDE 0.9 % IV SOLN: INTRAVENOUS | Status: DC

## 2016-09-15 MED ORDER — ONDANSETRON HCL 4 MG/2ML IJ SOLN
4.0000 mg | Freq: Once | INTRAMUSCULAR | Status: AC
  Administered 2016-09-15: 4 mg via INTRAVENOUS
  Filled 2016-09-15: qty 2

## 2016-09-15 MED ORDER — LORAZEPAM 2 MG/ML IJ SOLN
2.0000 mg | Freq: Once | INTRAMUSCULAR | Status: AC
  Administered 2016-09-15: 2 mg via INTRAVENOUS
  Filled 2016-09-15: qty 1

## 2016-09-15 NOTE — ED Notes (Signed)
Pt requesting water, explained no PO food/fluids until CT and xray results are back. Given mouth swab. Pt agitated, RN attempted to console. Pt verbalized understanding of plan. EDP aware.

## 2016-09-15 NOTE — ED Provider Notes (Signed)
[facility]-EMERGENCY DEPT Provider Note   CSN: [id number] Arrival date & time: 09/15/16  [id number]     History   Chief Complaint Chief Complaint  Patient presents with  . Fall  . Hand Pain    HPI Wesley Blair is a 41 y.o. male.  Patient with a history of homelessness as well as schizophrenia. States he was running jumped off a roof injured his left wrist also complaining of low back pain. As well as neck pain and head pain and some chest pain. Obvious deformity to the left wrist area.      Past Medical History:  Diagnosis Date  . Schizophrenia [facility](HCC)     Patient Active Problem List   Diagnosis Date Noted  . Syncope 05/14/2016  . Acute kidney failure (HCC) 02/15/2016  . Dehydration 02/15/2016  . Muscle cramps 02/15/2016  . Cocaine use 02/15/2016    History reviewed. No pertinent surgical history.     Home Medications    Prior to Admission medications   Medication Sig Start Date End Date Taking? Authorizing Provider  cyclobenzaprine (FLEXERIL) 5 MG tablet Take 1 tablet (5 mg total) by mouth 3 (three) times daily as needed. Patient not taking: Reported on 09/15/2016 09/09/16   Lona KettleAshley Laurel Meyer, PA-C  naproxen (NAPROSYN) 500 MG tablet Take 1 tablet (500 mg total) by mouth 2 (two) times daily. Patient not taking: Reported on 09/15/2016 09/09/16   Lona KettleAshley Laurel Meyer, PA-C  naproxen (NAPROSYN) 500 MG tablet Take 1 tablet (500 mg total) by mouth 2 (two) times daily. 09/15/16   Vanetta MuldersScott Salvatore Shear, MD    Family History History reviewed. No pertinent family history.  Social History Social History  Substance Use Topics  . Smoking status: Current Every Day Smoker  . Smokeless tobacco: Never Used  . Alcohol use Yes     Comment: Socailly      Allergies   Patient has no known allergies.   Review of Systems Review of Systems  Constitutional: Negative for fever.  HENT: Negative for congestion.   Eyes: Negative for redness.  Respiratory: Negative for shortness of breath.     Cardiovascular: Negative for chest pain.  Gastrointestinal: Negative for abdominal pain.  Musculoskeletal: Positive for back pain and neck pain.  Neurological: Positive for headaches.  Hematological: Does not bruise/bleed easily.  Psychiatric/Behavioral: Negative for confusion and suicidal ideas.     Physical Exam Updated Vital Signs BP (!) 164/107   Pulse 93   Temp 97.6 F (36.4 C) (Oral)   Resp 23   Ht 6' (1.829 m)   Wt 81.6 kg   SpO2 100%   BMI 24.41 kg/m   Physical Exam  Constitutional: He appears well-developed and well-nourished. No distress.  HENT:  Head: Normocephalic and atraumatic.  Mucous membranes dry.  Eyes: Conjunctivae and EOM are normal. Pupils are equal, round, and reactive to light.  Neck: Normal range of motion. Neck supple.  Cardiovascular: Regular rhythm.   Tachycardic  Pulmonary/Chest: Effort normal and breath sounds normal. No respiratory distress.  Abdominal: Soft. Bowel sounds are normal. There is no tenderness.  Musculoskeletal: He exhibits edema, tenderness and deformity.   Deformity to left distal radius. Radial pulse 2+. Refill intact good range of motion of fingers.  Neurological: He is alert. No cranial nerve deficit or sensory deficit. He exhibits normal muscle tone. Coordination normal.  Skin: Skin is warm.  Nursing note and vitals reviewed.    ED Treatments / Results  Labs (all labs ordered are listed, but only abnormal results  are displayed) Labs Reviewed  COMPREHENSIVE METABOLIC PANEL - Abnormal; Notable for the following:       Result Value   AST 45 (*)    All other components within normal limits  ETHANOL - Abnormal; Notable for the following:    Alcohol, Ethyl (B) 7 (*)    All other components within normal limits  ACETAMINOPHEN LEVEL - Abnormal; Notable for the following:    Acetaminophen (Tylenol), Serum <10 (*)    All other components within normal limits  RAPID URINE DRUG SCREEN, HOSP PERFORMED - Abnormal; Notable for  the following:    Cocaine POSITIVE (*)    All other components within normal limits  CBC WITH DIFFERENTIAL/PLATELET  SALICYLATE LEVEL    EKG  EKG Interpretation None       Radiology Dg Chest 2 View  Result Date: 09/15/2016 CLINICAL DATA:  Larey Seat off the roof of a two-story house.  Smoker. EXAM: CHEST  2 VIEW COMPARISON:  Thoracic spine radiographs dated 05/12/2016. FINDINGS: Normal sized heart. Clear lungs. Central peribronchial thickening. Mild flattening of the hemidiaphragms. No fracture pneumothorax seen. IMPRESSION: Mild changes of COPD and chronic bronchitis.  No acute abnormality. Electronically Signed   By: Wesley Salts M.D.   On: 09/15/2016 10:38   Dg Lumbar Spine Complete  Result Date: 09/15/2016 CLINICAL DATA:  Larey Seat off a 2 story roof.  Central low back pain. EXAM: LUMBAR SPINE - COMPLETE 4+ VIEW COMPARISON:  None. FINDINGS: Five non-rib-bearing lumbar vertebrae. Mild to moderate anterior spur formation at multiple levels. No fractures, pars defects or subluxations. IMPRESSION: No fracture or subluxation.  Mild degenerative changes. Electronically Signed   By: Wesley Salts M.D.   On: 09/15/2016 10:39   Dg Wrist Complete Left  Result Date: 09/15/2016 CLINICAL DATA:  Larey Seat from second story.  Left wrist injury.  Pain. EXAM: LEFT WRIST - COMPLETE 3+ VIEW COMPARISON:  None. FINDINGS: A comminuted intra-articular fracture is present in the distal radius. There is 3 mm of ventral displacement. Intra-articular extension is noted. A minimally displaced ulnar styloid fracture is present. Carpal bones are intact. IMPRESSION: 1. Comminuted intra-articular fracture of the distal radius with 3 mm ventral displacement. 2. Minimally displaced ulnar styloid fracture. Electronically Signed   By: Marin Roberts M.D.   On: 09/15/2016 09:24   Ct Head Wo Contrast  Result Date: 09/15/2016 CLINICAL DATA:  Larey Seat from a two-story roof and hit the back of his head. EXAM: CT HEAD WITHOUT CONTRAST CT  CERVICAL SPINE WITHOUT CONTRAST TECHNIQUE: Multidetector CT imaging of the head and cervical spine was performed following the standard protocol without intravenous contrast. Multiplanar CT image reconstructions of the cervical spine were also generated. COMPARISON:  Head CT dated 05/14/2016. FINDINGS: CT HEAD FINDINGS Brain: No evidence of acute infarction, hemorrhage, hydrocephalus, extra-axial collection or mass lesion/mass effect. Vascular: No hyperdense vessel or unexpected calcification. Skull: Normal. Negative for fracture or focal lesion. Sinuses/Orbits: No acute finding. Other: None. CT CERVICAL SPINE FINDINGS Alignment: Straightening of the normal cervical lordosis. No subluxations. Skull base and vertebrae: No acute fracture. No primary bone lesion or focal pathologic process. Soft tissues and spinal canal: No prevertebral fluid or swelling. No visible canal hematoma. Disc levels:  Multilevel degenerative changes. Upper chest: Clear lung apices. Other: None. IMPRESSION: 1. Normal head CT. 2. No cervical spine fracture or subluxation. 3. Straightening of the normal cervical lordosis. 4. Multilevel cervical spine degenerative changes. Electronically Signed   By: Wesley Salts M.D.   On: 09/15/2016 10:45  Ct Cervical Spine Wo Contrast  Result Date: 09/15/2016 CLINICAL DATA:  Larey Seat from a two-story roof and hit the back of his head. EXAM: CT HEAD WITHOUT CONTRAST CT CERVICAL SPINE WITHOUT CONTRAST TECHNIQUE: Multidetector CT imaging of the head and cervical spine was performed following the standard protocol without intravenous contrast. Multiplanar CT image reconstructions of the cervical spine were also generated. COMPARISON:  Head CT dated 05/14/2016. FINDINGS: CT HEAD FINDINGS Brain: No evidence of acute infarction, hemorrhage, hydrocephalus, extra-axial collection or mass lesion/mass effect. Vascular: No hyperdense vessel or unexpected calcification. Skull: Normal. Negative for fracture or focal  lesion. Sinuses/Orbits: No acute finding. Other: None. CT CERVICAL SPINE FINDINGS Alignment: Straightening of the normal cervical lordosis. No subluxations. Skull base and vertebrae: No acute fracture. No primary bone lesion or focal pathologic process. Soft tissues and spinal canal: No prevertebral fluid or swelling. No visible canal hematoma. Disc levels:  Multilevel degenerative changes. Upper chest: Clear lung apices. Other: None. IMPRESSION: 1. Normal head CT. 2. No cervical spine fracture or subluxation. 3. Straightening of the normal cervical lordosis. 4. Multilevel cervical spine degenerative changes. Electronically Signed   By: Wesley Salts M.D.   On: 09/15/2016 10:45   Dg Hand Complete Left  Result Date: 09/15/2016 CLINICAL DATA:  Jumped from a second story. Injury to left hand and wrist. EXAM: LEFT HAND - COMPLETE 3+ VIEW COMPARISON:  None. FINDINGS: No acute bone or soft tissue abnormalities are present in the hand. A comminuted fracture is present in the distal radius with intra-articular extension. An ulnar styloid fracture is minimally displaced. IMPRESSION: 1. Comminuted distal radial fracture with intra-articular extension. 2. Minimally displaced ulnar styloid for tree. 3. No additional fractures in the hand. Electronically Signed   By: Marin Roberts M.D.   On: 09/15/2016 09:20    Procedures Procedures (including critical care time)  Medications Ordered in ED Medications  0.9 %  sodium chloride infusion ( Intravenous Hold 09/15/16 1045)  ondansetron (ZOFRAN) injection 4 mg (4 mg Intravenous Given 09/15/16 1041)  sodium chloride 0.9 % bolus 1,000 mL (0 mLs Intravenous Stopped 09/15/16 1145)  LORazepam (ATIVAN) injection 2 mg (2 mg Intravenous Given 09/15/16 1042)  sodium chloride 0.9 % bolus 1,000 mL (0 mLs Intravenous Stopped 09/15/16 1255)  sodium chloride 0.9 % bolus 1,000 mL (1,000 mLs Intravenous New Bag/Given 09/15/16 1301)     Initial Impression / Assessment and Plan / ED  Course  I have reviewed the triage vital signs and the nursing notes.  Pertinent labs & imaging results that were available during my care of the patient were reviewed by me and considered in my medical decision making (see chart for details).  Clinical Course     Patient reportedly homeless. Patient had a fall past history of cocaine abuse. Past history of schizophrenia. Patient denies any suicidal homicidal ideations. Evette Cristal here today with a little bit unusual behavior but does not seem to be floridly psychotic. Patient had a fall from jumping from a roof top. Extensive workup without any injuries other than a comminuted closed fracture to the left distal radius. This treated with a sugar tong splint and will follow-up with hand surgery. Patient also appeared dehydrated. Patient given 3 L of fluid now is urinating fine. Patient has a history of alcohol abuse there was some concerns early on that he may be going through withdrawal however patient given Ativan in the fluids and is doing much better. Heart rate down below 100. Patient wants to go home.  Final  Clinical Impressions(s) / ED Diagnoses   Final diagnoses:  Fall, initial encounter  Dehydration  Closed fracture of distal end of left radius, unspecified fracture morphology, initial encounter    New Prescriptions New Prescriptions   NAPROXEN (NAPROSYN) 500 MG TABLET    Take 1 tablet (500 mg total) by mouth 2 (two) times daily.     Vanetta Mulders, MD 09/15/16 [phone]

## 2016-09-15 NOTE — ED Notes (Signed)
PT did not need to urinate at this time

## 2016-09-15 NOTE — Discharge Instructions (Signed)
Keep splint in place and dry. Make an appointment to follow-up with hand surgery. Elevate your left hand as much as possible for the next 2 days. Take Naprosyn as needed for pain.

## 2016-09-15 NOTE — ED Notes (Signed)
Paged ortho to apply sugar tongue splint.

## 2016-09-15 NOTE — Progress Notes (Signed)
Orthopedic Tech Progress Note Patient Details:  Wesley Blair Mar 17, 1976 [id number]  Ortho Devices Type of Ortho Device: Arm sling, Ace wrap, Sugartong splint Ortho Device/Splint Interventions: Application   Saul FordyceJennifer C Taz Vanness 09/15/2016, 11:36 AM

## 2016-09-15 NOTE — ED Notes (Signed)
EDP aware HR 115-130bpm. No new orders at this time.

## 2016-09-15 NOTE — ED Notes (Signed)
Pt transported to CT ?

## 2016-09-15 NOTE — ED Provider Notes (Signed)
MSE was initiated and I personally evaluated the patient and placed orders (if any) at  8:47 AM on September 15, 2016.  Wesley Blair is a 41 y.o. male with a PMHx of schizophrenia and cocaine use, who presents to the ED with complaints of left wrist pain after a fall. Patient states that he was having paranoid thoughts and felt like someone was after him, has been running for the last 2 hours to run away from this person, has been jumping from rooftop to rooftop in order to get away from them and around 3 AM he fell off of a roof that he claims was a two-story fall. He states he hit the back of his head on the pavement as well as his left wrist. He admits that he was drinking alcohol last night, but denies being intoxicated at 3 AM when he fell. Patient is a very poor historian likely due to his psychiatric illness; unclear if he truly fell from 2 story height or not. C/o back pain and L wrist pain, states his head "is fine".  On exam, speech tangential and flight of ideas thought process; no scalp crepitus or deformity, mild lumbar TTP, L wrist splinted. No other focal bony TTP to remainder of extremities. No abdominal or chest wall tenderness or crepitus.   Due to concern for traumatic fall, will order more imaging studies, labs, and move pt to appropriate room  The patient appears stable so that the remainder of the MSE may be completed by another provider.     [address]pp Bonifacio Pruden, PA-C 09/15/16 0900    Vanetta MuldersScott Zackowski, MD 09/15/16 [phone]

## 2016-09-15 NOTE — ED Triage Notes (Signed)
Arrived by [facility]. Pt reports falling a few hours ago, has pain to left wrist.

## 2016-09-15 NOTE — ED Triage Notes (Signed)
PT reports at 0300 this morning  he was running away from some people and was jumping rooftops to get away from trhem . Pt reports he fell off a 2 story house and hit his head. Pt observed to have tremors in both hands. Pt reports he drinks beer daily and liquor daily.

## 2016-09-16 ENCOUNTER — Encounter (HOSPITAL_COMMUNITY): Payer: Self-pay | Admitting: Emergency Medicine

## 2016-09-16 ENCOUNTER — Inpatient Hospital Stay (HOSPITAL_COMMUNITY)
Admission: EM | Admit: 2016-09-16 | Discharge: 2016-09-16 | DRG: 558 | Discharge status: Against Medical Advice | Payer: Self-pay | Attending: Student in an Organized Health Care Education/Training Program | Admitting: Student in an Organized Health Care Education/Training Program

## 2016-09-16 ENCOUNTER — Emergency Department (HOSPITAL_COMMUNITY)
Admission: EM | Admit: 2016-09-16 | Discharge: 2016-09-16 | Disposition: A | Discharge status: Home / Self Care | Payer: Self-pay | Attending: Emergency Medicine | Admitting: Emergency Medicine

## 2016-09-16 DIAGNOSIS — F17200 Nicotine dependence, unspecified, uncomplicated: Secondary | ICD-10-CM | POA: Insufficient documentation

## 2016-09-16 DIAGNOSIS — M79602 Pain in left arm: Secondary | ICD-10-CM | POA: Insufficient documentation

## 2016-09-16 DIAGNOSIS — E876 Hypokalemia: Secondary | ICD-10-CM | POA: Diagnosis present

## 2016-09-16 DIAGNOSIS — F1410 Cocaine abuse, uncomplicated: Secondary | ICD-10-CM | POA: Diagnosis present

## 2016-09-16 DIAGNOSIS — R451 Restlessness and agitation: Secondary | ICD-10-CM | POA: Insufficient documentation

## 2016-09-16 DIAGNOSIS — Z590 Homelessness: Secondary | ICD-10-CM

## 2016-09-16 DIAGNOSIS — F22 Delusional disorders: Secondary | ICD-10-CM

## 2016-09-16 DIAGNOSIS — F209 Schizophrenia, unspecified: Secondary | ICD-10-CM | POA: Diagnosis present

## 2016-09-16 DIAGNOSIS — M6282 Rhabdomyolysis: Principal | ICD-10-CM | POA: Diagnosis present

## 2016-09-16 DIAGNOSIS — F1490 Cocaine use, unspecified, uncomplicated: Secondary | ICD-10-CM | POA: Diagnosis present

## 2016-09-16 HISTORY — DX: Homelessness unspecified: Z59.00

## 2016-09-16 HISTORY — DX: Alcohol abuse, uncomplicated: F10.10

## 2016-09-16 HISTORY — DX: Cocaine use, unspecified, uncomplicated: F14.90

## 2016-09-16 HISTORY — DX: Unspecified fracture of left forearm, initial encounter for closed fracture: S52.92XA

## 2016-09-16 HISTORY — DX: Homelessness: Z59.0

## 2016-09-16 LAB — CBC WITH DIFFERENTIAL/PLATELET
BASOS ABS: 0 10*3/uL (ref 0.0–0.1)
Basophils Relative: 0 %
EOS ABS: 0 10*3/uL (ref 0.0–0.7)
EOS PCT: 0 %
HCT: 34.3 % — ABNORMAL LOW (ref 39.0–52.0)
Hemoglobin: 11.3 g/dL — ABNORMAL LOW (ref 13.0–17.0)
LYMPHS ABS: 0.8 10*3/uL (ref 0.7–4.0)
Lymphocytes Relative: 23 %
MCH: 27.4 pg (ref 26.0–34.0)
MCHC: 32.9 g/dL (ref 30.0–36.0)
MCV: 83.3 fL (ref 78.0–100.0)
Monocytes Absolute: 0.2 10*3/uL (ref 0.1–1.0)
Monocytes Relative: 6 %
Neutro Abs: 2.7 10*3/uL (ref 1.7–7.7)
Neutrophils Relative %: 71 %
PLATELETS: 151 10*3/uL (ref 150–400)
RBC: 4.12 MIL/uL — AB (ref 4.22–5.81)
RDW: 14.5 % (ref 11.5–15.5)
WBC: 3.7 10*3/uL — AB (ref 4.0–10.5)

## 2016-09-16 LAB — URINALYSIS, COMPLETE (UACMP) WITH MICROSCOPIC
BACTERIA UA: NONE SEEN
BILIRUBIN URINE: NEGATIVE
Glucose, UA: NEGATIVE mg/dL
Hgb urine dipstick: NEGATIVE
KETONES UR: NEGATIVE mg/dL
LEUKOCYTES UA: NEGATIVE
NITRITE: NEGATIVE
PH: 6 (ref 5.0–8.0)
Protein, ur: NEGATIVE mg/dL
Specific Gravity, Urine: 1.006 (ref 1.005–1.030)
Squamous Epithelial / HPF: NONE SEEN

## 2016-09-16 LAB — COMPREHENSIVE METABOLIC PANEL
ALK PHOS: 47 U/L (ref 38–126)
ALT: 24 U/L (ref 17–63)
AST: 71 U/L — AB (ref 15–41)
Albumin: 3.7 g/dL (ref 3.5–5.0)
Anion gap: 9 (ref 5–15)
BILIRUBIN TOTAL: 1.1 mg/dL (ref 0.3–1.2)
BUN: 7 mg/dL (ref 6–20)
CALCIUM: 8.7 mg/dL — AB (ref 8.9–10.3)
CO2: 22 mmol/L (ref 22–32)
CREATININE: 0.99 mg/dL (ref 0.61–1.24)
Chloride: 105 mmol/L (ref 101–111)
GFR calc Af Amer: >60 mL/min (ref >60)
Glucose, Bld: 81 mg/dL (ref 65–99)
Potassium: 3.3 mmol/L — ABNORMAL LOW (ref 3.5–5.1)
Sodium: 136 mmol/L (ref 135–145)
TOTAL PROTEIN: 6.5 g/dL (ref 6.5–8.1)

## 2016-09-16 LAB — MRSA PCR SCREENING: MRSA by PCR: NEGATIVE

## 2016-09-16 LAB — RAPID URINE DRUG SCREEN, HOSP PERFORMED
Amphetamines: NOT DETECTED
BARBITURATES: NOT DETECTED
Benzodiazepines: POSITIVE — AB
COCAINE: POSITIVE — AB
Opiates: NOT DETECTED
Tetrahydrocannabinol: NOT DETECTED

## 2016-09-16 LAB — ACETAMINOPHEN LEVEL

## 2016-09-16 LAB — ETHANOL: Alcohol, Ethyl (B): <5 mg/dL (ref <5)

## 2016-09-16 LAB — SALICYLATE LEVEL: Salicylate Lvl: <7 mg/dL (ref 2.8–30.0)

## 2016-09-16 LAB — CK: Total CK: 3024 U/L — ABNORMAL HIGH (ref 49–397)

## 2016-09-16 MED ORDER — LORAZEPAM 2 MG/ML IJ SOLN: 1.0000 mg | Freq: Four times a day (QID) | INTRAMUSCULAR | Status: DC | PRN

## 2016-09-16 MED ORDER — HYDROCODONE-ACETAMINOPHEN 5-325 MG PO TABS: 1.0000 | ORAL_TABLET | Freq: Four times a day (QID) | ORAL | Status: DC | PRN

## 2016-09-16 MED ORDER — FOLIC ACID 1 MG PO TABS
1.0000 mg | ORAL_TABLET | Freq: Every day | ORAL | Status: DC
  Filled 2016-09-16: qty 1

## 2016-09-16 MED ORDER — LORAZEPAM 1 MG PO TABS: 1.0000 mg | ORAL_TABLET | Freq: Four times a day (QID) | ORAL | Status: DC | PRN

## 2016-09-16 MED ORDER — LORAZEPAM 2 MG/ML IJ SOLN
2.0000 mg | Freq: Once | INTRAMUSCULAR | Status: AC
  Administered 2016-09-16: 2 mg via INTRAMUSCULAR

## 2016-09-16 MED ORDER — ADULT MULTIVITAMIN W/MINERALS CH
1.0000 | ORAL_TABLET | Freq: Every day | ORAL | Status: DC
  Filled 2016-09-16: qty 1

## 2016-09-16 MED ORDER — ONDANSETRON HCL 4 MG/2ML IJ SOLN: 4.0000 mg | Freq: Four times a day (QID) | INTRAMUSCULAR | Status: DC | PRN

## 2016-09-16 MED ORDER — ONDANSETRON HCL 4 MG PO TABS: 4.0000 mg | ORAL_TABLET | Freq: Four times a day (QID) | ORAL | Status: DC | PRN

## 2016-09-16 MED ORDER — SODIUM CHLORIDE 0.9 % IV BOLUS (SEPSIS)
1000.0000 mL | Freq: Once | INTRAVENOUS | Status: AC
  Administered 2016-09-16: 1000 mL via INTRAVENOUS

## 2016-09-16 MED ORDER — HALOPERIDOL LACTATE 5 MG/ML IJ SOLN
5.0000 mg | Freq: Once | INTRAMUSCULAR | Status: AC
Start: 2016-09-16 — End: 2016-09-16
  Administered 2016-09-16: 5 mg via INTRAMUSCULAR

## 2016-09-16 MED ORDER — HYDROCODONE-ACETAMINOPHEN 5-325 MG PO TABS
1.0000 | ORAL_TABLET | Freq: Once | ORAL | Status: AC
  Administered 2016-09-16: 1 via ORAL
  Filled 2016-09-16: qty 1

## 2016-09-16 MED ORDER — LORAZEPAM 1 MG PO TABS: 0.0000 mg | ORAL_TABLET | Freq: Two times a day (BID) | ORAL | Status: DC

## 2016-09-16 MED ORDER — SODIUM CHLORIDE 0.9 % IV SOLN
INTRAVENOUS | Status: DC
  Administered 2016-09-16: 1000 mL via INTRAVENOUS

## 2016-09-16 MED ORDER — LORAZEPAM 1 MG PO TABS: 0.0000 mg | ORAL_TABLET | Freq: Four times a day (QID) | ORAL | Status: DC

## 2016-09-16 MED ORDER — ACETAMINOPHEN 325 MG PO TABS: 650.0000 mg | ORAL_TABLET | Freq: Four times a day (QID) | ORAL | Status: DC | PRN

## 2016-09-16 MED ORDER — THIAMINE HCL 100 MG/ML IJ SOLN: 100.0000 mg | Freq: Every day | INTRAMUSCULAR | Status: DC

## 2016-09-16 MED ORDER — VITAMIN B-1 100 MG PO TABS
100.0000 mg | ORAL_TABLET | Freq: Every day | ORAL | Status: DC
  Filled 2016-09-16: qty 1

## 2016-09-16 MED ORDER — HEPARIN SODIUM (PORCINE) 5000 UNIT/ML IJ SOLN: 5000.0000 [IU] | Freq: Three times a day (TID) | INTRAMUSCULAR | Status: DC

## 2016-09-16 MED ORDER — ACETAMINOPHEN 650 MG RE SUPP: 650.0000 mg | Freq: Four times a day (QID) | RECTAL | Status: DC | PRN

## 2016-09-16 NOTE — Progress Notes (Signed)
Received multiple pages that the patient wants to leave the hospital / leave AMA. Visited the patient and discussed the issue at length. He is alert and oriented, denies SI, HI, audiovisual hallucinations and is angry that he received a heart healthy diet and that we had ordered a bedside sitter. He adamantly states that he is not interested in hurting himself or ending his life, and states that he has talked to numerous psychiatrists in the past for this. [facility] is [facility] "where they have nicer people and good food." We discussed our desire for him to remain inpatient to monitor and treat his rhabdomyolysis and for him to undergo psychiatry evaluation. He stated understanding of these issues and is not interested in staying in this hospital, referring repeatedly to other medical centers in the area or in [place] that [place]. We discussed with Dr. Oswaldo DoneVincent and psychiatry and we have no grounds currently for involuntary commitment. Patient illustrates decision making capacity and states understanding of the risks of leaving the hospital against medical advice - including worsening rhabdomyolysis and kidney damage.

## 2016-09-16 NOTE — Progress Notes (Signed)
Patient trasfered from ED to 5W06 via wheelchair; alert and oriented x 3, confused, agitated, anxious; no complaints of pain; IV saline locked in RAC; Orient patient to room and unit;  gave patient care guide; instructed how to use the call bell and  fall risk precautions. Will continue to monitor the patient.

## 2016-09-16 NOTE — ED Notes (Signed)
Pt verbalized understanding discharge instructions and denies any further needs or questions at this time. VS stable, ambulatory and steady gait.   

## 2016-09-16 NOTE — Progress Notes (Signed)
Patient left AMA. IV D/C. Per MD patient can leave AMA. MD made aware.

## 2016-09-16 NOTE — ED Triage Notes (Addendum)
Pt here for left arm pain. Pt had arm fx 2 days ago and seen here, cast in place. Pt states worsening pain x 36 hours. CMS intact distal to cast. Pt left AMA from here earlier today, pt had rhabdo.

## 2016-09-16 NOTE — ED Provider Notes (Signed)
[facility]-EMERGENCY DEPT Provider Note   CSN: [id number] Arrival date & time: 09/16/16  0417     History   Chief Complaint Chief Complaint  Patient presents with  . Altered Mental Status  LEVEL 5 CAVEAT DUE TO ALTERED MENTAL STATUS   HPI Shamir Tuzzolino is a 41 y.o. male.  The history is provided by the patient and the police. The history is limited by the condition of the patient.  Altered Mental Status   This is a new problem. Episode onset: unknown. The problem has been rapidly worsening. Associated symptoms include confusion and delusions. Associated symptoms comments: Paranoia . Risk factors include illicit drug use.  Patient with h/o substance abuse and reportedly h/o schizophrenia, presents with acute paranoia Per police, pt was found at [facility] not acting appropriately He refused to leave and was acting very paranoid He mentioned using cocaine Police are familiar with patient and he is not usually this confused No other details are known   Past Medical History:  Diagnosis Date  . Schizophrenia [facility])     Patient Active Problem List   Diagnosis Date Noted  . Syncope 05/14/2016  . Acute kidney failure (HCC) 02/15/2016  . Dehydration 02/15/2016  . Muscle cramps 02/15/2016  . Cocaine use 02/15/2016    History reviewed. No pertinent surgical history.     Home Medications    Prior to Admission medications   Medication Sig Start Date End Date Taking? Authorizing Provider  cyclobenzaprine (FLEXERIL) 5 MG tablet Take 1 tablet (5 mg total) by mouth 3 (three) times daily as needed. Patient not taking: Reported on 09/15/2016 09/09/16   Lona Kettle, PA-C  naproxen (NAPROSYN) 500 MG tablet Take 1 tablet (500 mg total) by mouth 2 (two) times daily. Patient not taking: Reported on 09/15/2016 09/09/16   Lona Kettle, PA-C  naproxen (NAPROSYN) 500 MG tablet Take 1 tablet (500 mg total) by mouth 2 (two) times daily. 09/15/16   Vanetta Mulders, MD    Family  History No family history on file.  Social History Social History  Substance Use Topics  . Smoking status: Current Every Day Smoker  . Smokeless tobacco: Never Used  . Alcohol use Yes     Comment: Socailly      Allergies   Patient has no known allergies.   Review of Systems Review of Systems  Unable to perform ROS: Mental status change  Psychiatric/Behavioral: Positive for confusion.     Physical Exam Updated Vital Signs BP 144/97 (BP Location: Right Arm)   Pulse 84   Temp 98.5 F (36.9 C) (Rectal)   Resp 18   SpO2 96%   Physical Exam CONSTITUTIONAL: Disheveled, agitated, anxious HEAD: Normocephalic/atraumatic, no visible trauma EYES: EOMI/Pupils pinpoint ENMT: Mucous membranes moist NECK: supple no meningeal signs CV: S1/S2 noted, no murmurs/rubs/gallops noted LUNGS: Lungs are clear to auscultation bilaterally, no apparent distress Chest - no bruising/crepitus or obvious tenderness ABDOMEN: soft, nontender, no bruising noted NEURO: Pt is awake/alert, moves all extremitiesx4.  No facial droop.  He is ambulatory.  He is confused.   EXTREMITIES:  full ROM, he has splint to left wrist (from previous injury)  No other signs of trauma  SKIN: warm, color normal PSYCH: agitated.  He appears paranoid.  He appears to be internally stimulated.  He keeps looking in and out of the room.  He reports there is a bomb in the room.  He thinks his niece is in the room with him   ED Treatments /  Results  Labs (all labs ordered are listed, but only abnormal results are displayed) Labs Reviewed  COMPREHENSIVE METABOLIC PANEL - Abnormal; Notable for the following:       Result Value   Potassium 3.3 (*)    Calcium 8.7 (*)    AST 71 (*)    All other components within normal limits  CBC WITH DIFFERENTIAL/PLATELET - Abnormal; Notable for the following:    WBC 3.7 (*)    RBC 4.12 (*)    Hemoglobin 11.3 (*)    HCT 34.3 (*)    All other components within normal limits    ACETAMINOPHEN LEVEL - Abnormal; Notable for the following:    Acetaminophen (Tylenol), Serum <10 (*)    All other components within normal limits  RAPID URINE DRUG SCREEN, HOSP PERFORMED - Abnormal; Notable for the following:    Cocaine POSITIVE (*)    Benzodiazepines POSITIVE (*)    All other components within normal limits  CK - Abnormal; Notable for the following:    Total CK 3,024 (*)    All other components within normal limits  ETHANOL  SALICYLATE LEVEL    EKG  EKG Interpretation  Date/Time:  Monday September 16 2016 05:09:15 EST Ventricular Rate:  89 PR Interval:    QRS Duration: 87 QT Interval:  390 QTC Calculation: 475 R Axis:   -47 Text Interpretation:  Sinus rhythm Probable left atrial enlargement Left anterior fascicular block Abnormal R-wave progression, early transition Left ventricular hypertrophy No significant change since last tracing Confirmed by Bebe Shaggy  MD, Demita Tobia ([id number]) on 09/16/2016 5:15:18 AM       Radiology Dg Chest 2 View  Result Date: 09/15/2016 CLINICAL DATA:  Larey Seat off the roof of a two-story house.  Smoker. EXAM: CHEST  2 VIEW COMPARISON:  Thoracic spine radiographs dated 05/12/2016. FINDINGS: Normal sized heart. Clear lungs. Central peribronchial thickening. Mild flattening of the hemidiaphragms. No fracture pneumothorax seen. IMPRESSION: Mild changes of COPD and chronic bronchitis.  No acute abnormality. Electronically Signed   By: Beckie Salts M.D.   On: 09/15/2016 10:38   Dg Lumbar Spine Complete  Result Date: 09/15/2016 CLINICAL DATA:  Larey Seat off a 2 story roof.  Central low back pain. EXAM: LUMBAR SPINE - COMPLETE 4+ VIEW COMPARISON:  None. FINDINGS: Five non-rib-bearing lumbar vertebrae. Mild to moderate anterior spur formation at multiple levels. No fractures, pars defects or subluxations. IMPRESSION: No fracture or subluxation.  Mild degenerative changes. Electronically Signed   By: Beckie Salts M.D.   On: 09/15/2016 10:39   Dg Wrist Complete  Left  Result Date: 09/15/2016 CLINICAL DATA:  Larey Seat from second story.  Left wrist injury.  Pain. EXAM: LEFT WRIST - COMPLETE 3+ VIEW COMPARISON:  None. FINDINGS: A comminuted intra-articular fracture is present in the distal radius. There is 3 mm of ventral displacement. Intra-articular extension is noted. A minimally displaced ulnar styloid fracture is present. Carpal bones are intact. IMPRESSION: 1. Comminuted intra-articular fracture of the distal radius with 3 mm ventral displacement. 2. Minimally displaced ulnar styloid fracture. Electronically Signed   By: Marin Roberts M.D.   On: 09/15/2016 09:24   Ct Head Wo Contrast  Result Date: 09/15/2016 CLINICAL DATA:  Larey Seat from a two-story roof and hit the back of his head. EXAM: CT HEAD WITHOUT CONTRAST CT CERVICAL SPINE WITHOUT CONTRAST TECHNIQUE: Multidetector CT imaging of the head and cervical spine was performed following the standard protocol without intravenous contrast. Multiplanar CT image reconstructions of the cervical spine were also generated.  COMPARISON:  Head CT dated 05/14/2016. FINDINGS: CT HEAD FINDINGS Brain: No evidence of acute infarction, hemorrhage, hydrocephalus, extra-axial collection or mass lesion/mass effect. Vascular: No hyperdense vessel or unexpected calcification. Skull: Normal. Negative for fracture or focal lesion. Sinuses/Orbits: No acute finding. Other: None. CT CERVICAL SPINE FINDINGS Alignment: Straightening of the normal cervical lordosis. No subluxations. Skull base and vertebrae: No acute fracture. No primary bone lesion or focal pathologic process. Soft tissues and spinal canal: No prevertebral fluid or swelling. No visible canal hematoma. Disc levels:  Multilevel degenerative changes. Upper chest: Clear lung apices. Other: None. IMPRESSION: 1. Normal head CT. 2. No cervical spine fracture or subluxation. 3. Straightening of the normal cervical lordosis. 4. Multilevel cervical spine degenerative changes.  Electronically Signed   By: Beckie Salts M.D.   On: 09/15/2016 10:45   Ct Cervical Spine Wo Contrast  Result Date: 09/15/2016 CLINICAL DATA:  Larey Seat from a two-story roof and hit the back of his head. EXAM: CT HEAD WITHOUT CONTRAST CT CERVICAL SPINE WITHOUT CONTRAST TECHNIQUE: Multidetector CT imaging of the head and cervical spine was performed following the standard protocol without intravenous contrast. Multiplanar CT image reconstructions of the cervical spine were also generated. COMPARISON:  Head CT dated 05/14/2016. FINDINGS: CT HEAD FINDINGS Brain: No evidence of acute infarction, hemorrhage, hydrocephalus, extra-axial collection or mass lesion/mass effect. Vascular: No hyperdense vessel or unexpected calcification. Skull: Normal. Negative for fracture or focal lesion. Sinuses/Orbits: No acute finding. Other: None. CT CERVICAL SPINE FINDINGS Alignment: Straightening of the normal cervical lordosis. No subluxations. Skull base and vertebrae: No acute fracture. No primary bone lesion or focal pathologic process. Soft tissues and spinal canal: No prevertebral fluid or swelling. No visible canal hematoma. Disc levels:  Multilevel degenerative changes. Upper chest: Clear lung apices. Other: None. IMPRESSION: 1. Normal head CT. 2. No cervical spine fracture or subluxation. 3. Straightening of the normal cervical lordosis. 4. Multilevel cervical spine degenerative changes. Electronically Signed   By: Beckie Salts M.D.   On: 09/15/2016 10:45   Dg Hand Complete Left  Result Date: 09/15/2016 CLINICAL DATA:  Jumped from a second story. Injury to left hand and wrist. EXAM: LEFT HAND - COMPLETE 3+ VIEW COMPARISON:  None. FINDINGS: No acute bone or soft tissue abnormalities are present in the hand. A comminuted fracture is present in the distal radius with intra-articular extension. An ulnar styloid fracture is minimally displaced. IMPRESSION: 1. Comminuted distal radial fracture with intra-articular extension. 2.  Minimally displaced ulnar styloid for tree. 3. No additional fractures in the hand. Electronically Signed   By: Marin Roberts M.D.   On: 09/15/2016 09:20    Procedures Procedures  CRITICAL CARE Performed by: Joya Gaskins Total critical care time: 33 minutes Critical care time was exclusive of separately billable procedures and treating other patients. Critical care was necessary to treat or prevent imminent or life-threatening deterioration. Critical care was time spent personally by me on the following activities: development of treatment plan with patient and/or surrogate as well as nursing, discussions with consultants, evaluation of patient's response to treatment, examination of patient, obtaining history from patient or surrogate, ordering and performing treatments and interventions, ordering and review of laboratory studies, ordering and review of radiographic studies, pulse oximetry and re-evaluation of patient's condition. PATIENT REQUIRED HALDOL/ATIVAN FOR AGITATION/PSYCHOSIS   Medications Ordered in ED Medications  sodium chloride 0.9 % bolus 1,000 mL (not administered)  haloperidol lactate (HALDOL) injection 5 mg (5 mg Intramuscular Given 09/16/16 0425)  LORazepam (ATIVAN) injection 2 mg (  2 mg Intramuscular Given 09/16/16 0425)  sodium chloride 0.9 % bolus 1,000 mL (1,000 mLs Intravenous New Bag/Given 09/16/16 0748)     Initial Impression / Assessment and Plan / ED Course  I have reviewed the triage vital signs and the nursing notes.  Pertinent labs  results that were available during my care of the patient were reviewed by me and considered in my medical decision making (see chart for details).  Clinical Course    5:55 AM Pt now resting comfortably Initially brought in for paranoia Suspect this is substance induced He was given haldol/ativan due to condition/flight risk and agitation Pt stable at this time Labs pending 6:36 AM Pt stable, resting  comfortably 8:11 AM Will need medical admission for rhabdomyolysis He will need IV fluids Once awake he will also require psych consult Suspect rhadbo is from cocaine abuse Unassigned admission paged Pt resting comfortably, easily arousable 8:30 AM Admit to medicine service, under dr Oswaldo Donevincent Final Clinical Impressions(s) / ED Diagnoses   Final diagnoses:  Paranoia (HCC)  Cocaine abuse  Delusional disorder (HCC)  Non-traumatic rhabdomyolysis    New Prescriptions New Prescriptions   No medications on file     Zadie Rhineonald Louna Rothgeb, MD 09/16/16 0830

## 2016-09-16 NOTE — Care Management Note (Signed)
Case Management Note  Patient Details  Name: Wesley Blair MRN: [id number] Date of Birth: 04-28-1976  Subjective/Objective:                 Patient admitted for: Psychosis, likely substance-induced: Patient demonstrates paranoid thought patterns over the past 2 days.  He admits to cocaine and alcohol use over this time period. UDS positive for cocaine.  - psych consulted - suicide precautions  Rhabdomyolysis: CK - 3024 - will start on 125 cc/hr NS - trend CK   Action/Plan:  CM will continue to follow for DC planning needs. Psych consult pending, [facility] information placed on AVS.   Expected Discharge Date:                  Expected Discharge Plan:  Home/Self Care  In-House Referral:     Discharge planning Services  CM Consult, Indigent [facility]  Post Acute Care Choice:    Choice offered to:     DME Arranged:    DME Agency:     HH Arranged:    HH Agency:     Status of Service:  In process, will continue to follow  If discussed at Long Length of Stay Meetings, dates discussed:    Additional Comments:  Lawerance SabalDebbie Dereonna Lensing, RN 09/16/2016, 2:39 PM
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
## 2016-09-16 NOTE — ED Notes (Signed)
Pt has had another 2 cups of water. Pt is still yelling comments out at nurses and Pts

## 2016-09-16 NOTE — ED Triage Notes (Signed)
Pt transported from jail after d/t patient behavior, ingested unkn substances.  [facility]  Per [employer] pt is known to them but behaviors seen tonight are new.  pt seen yesterday for reports of fall from 2 story building.

## 2016-09-16 NOTE — ED Notes (Signed)
Pt refused to sign discharge paperwork.

## 2016-09-16 NOTE — Progress Notes (Signed)
Patient ID: Wesley Blair, male   DOB: 1976/08/11, 41 y.o.   MRN: [id number]   Patient was not able to participate in psych evaluation due to sedation and will try to evaluate tomorrow. Reviewed the information documented and agree with the treatment plan.  Wesley Blair 09/16/2016 3:55 PM

## 2016-09-16 NOTE — Progress Notes (Signed)
Patient refusing treatment and medication. He verbalized that he wants to leave. He is feeling like being in prison. MD notified. Will continue to monitor.

## 2016-09-16 NOTE — Progress Notes (Signed)
MD notified about patient verbalizing that he wants to leave. He said he wants "to go in heaven". Will continue to monitor. Sitter at bedside.
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
## 2016-09-16 NOTE — ED Provider Notes (Signed)
[facility]-EMERGENCY DEPT Provider Note   CSN: [id number] Arrival date & time: 09/16/16  1924     History   Chief Complaint Chief Complaint  Patient presents with  . Arm Pain    HPI Wesley Blair is a 41 y.o. male.   Arm Injury   This is a new problem. The current episode started more than 2 days ago. The problem occurs constantly. The problem has not changed since onset.The pain is present in the left wrist. The quality of the pain is described as sharp and aching. The pain is mild. Associated symptoms include limited range of motion and stiffness. Pertinent negatives include no numbness.    Past Medical History:  Diagnosis Date  . Alcohol abuse   . Crack cocaine use   . Homelessness   . Left radial fracture   . Schizophrenia [facility])     Patient Active Problem List   Diagnosis Date Noted  . Rhabdomyolysis 09/16/2016  . Syncope 05/14/2016  . Acute kidney failure (HCC) 02/15/2016  . Dehydration 02/15/2016  . Muscle cramps 02/15/2016  . Cocaine use 02/15/2016    No past surgical history on file.     Home Medications    Prior to Admission medications   Medication Sig Start Date End Date Taking? Authorizing Provider  naproxen (NAPROSYN) 500 MG tablet Take 1 tablet (500 mg total) by mouth 2 (two) times daily. 09/15/16   Vanetta Mulders, MD    Family History No family history on file.  Social History Social History  Substance Use Topics  . Smoking status: Current Every Day Smoker  . Smokeless tobacco: Never Used  . Alcohol use Yes     Comment: Reports 2 beers daily     Allergies   Patient has no known allergies.   Review of Systems Review of Systems  Musculoskeletal: Positive for stiffness.  Neurological: Negative for numbness.  Psychiatric/Behavioral: Positive for hallucinations and sleep disturbance.  All other systems reviewed and are negative.    Physical Exam Updated Vital Signs BP 130/99 (BP Location: Right Arm)   Pulse 92   Temp 98.3 F  (36.8 C) (Oral)   Resp 18   Ht 6' (1.829 m)   Wt 179 lb (81.2 kg)   SpO2 100%   BMI 24.28 kg/m   Physical Exam  Constitutional: He appears well-developed and well-nourished.  HENT:  Head: Normocephalic and atraumatic.  Neck: Normal range of motion.  Cardiovascular: Tachycardia present.   Pulmonary/Chest: Effort normal. No respiratory distress.  Abdominal: He exhibits no distension.  Neurological: He is alert.  Psychiatric: His mood appears anxious. His speech is rapid and/or pressured and tangential. He is agitated. Thought content is paranoid and delusional. He expresses impulsivity and inappropriate judgment. He expresses no homicidal and no suicidal ideation. He expresses no suicidal plans and no homicidal plans. He is inattentive.  Nursing note and vitals reviewed.    ED Treatments / Results  Labs (all labs ordered are listed, but only abnormal results are displayed) Labs Reviewed - No data to display  EKG  EKG Interpretation None       Radiology Dg Chest 2 View  Result Date: 09/15/2016 CLINICAL DATA:  Larey Seat off the roof of a two-story house.  Smoker. EXAM: CHEST  2 VIEW COMPARISON:  Thoracic spine radiographs dated 05/12/2016. FINDINGS: Normal sized heart. Clear lungs. Central peribronchial thickening. Mild flattening of the hemidiaphragms. No fracture pneumothorax seen. IMPRESSION: Mild changes of COPD and chronic bronchitis.  No acute abnormality. Electronically Signed  By: Beckie Salts M.D.   On: 09/15/2016 10:38   Dg Lumbar Spine Complete  Result Date: 09/15/2016 CLINICAL DATA:  Larey Seat off a 2 story roof.  Central low back pain. EXAM: LUMBAR SPINE - COMPLETE 4+ VIEW COMPARISON:  None. FINDINGS: Five non-rib-bearing lumbar vertebrae. Mild to moderate anterior spur formation at multiple levels. No fractures, pars defects or subluxations. IMPRESSION: No fracture or subluxation.  Mild degenerative changes. Electronically Signed   By: Beckie Salts M.D.   On: 09/15/2016  10:39   Dg Wrist Complete Left  Result Date: 09/15/2016 CLINICAL DATA:  Larey Seat from second story.  Left wrist injury.  Pain. EXAM: LEFT WRIST - COMPLETE 3+ VIEW COMPARISON:  None. FINDINGS: A comminuted intra-articular fracture is present in the distal radius. There is 3 mm of ventral displacement. Intra-articular extension is noted. A minimally displaced ulnar styloid fracture is present. Carpal bones are intact. IMPRESSION: 1. Comminuted intra-articular fracture of the distal radius with 3 mm ventral displacement. 2. Minimally displaced ulnar styloid fracture. Electronically Signed   By: Marin Roberts M.D.   On: 09/15/2016 09:24   Ct Head Wo Contrast  Result Date: 09/15/2016 CLINICAL DATA:  Larey Seat from a two-story roof and hit the back of his head. EXAM: CT HEAD WITHOUT CONTRAST CT CERVICAL SPINE WITHOUT CONTRAST TECHNIQUE: Multidetector CT imaging of the head and cervical spine was performed following the standard protocol without intravenous contrast. Multiplanar CT image reconstructions of the cervical spine were also generated. COMPARISON:  Head CT dated 05/14/2016. FINDINGS: CT HEAD FINDINGS Brain: No evidence of acute infarction, hemorrhage, hydrocephalus, extra-axial collection or mass lesion/mass effect. Vascular: No hyperdense vessel or unexpected calcification. Skull: Normal. Negative for fracture or focal lesion. Sinuses/Orbits: No acute finding. Other: None. CT CERVICAL SPINE FINDINGS Alignment: Straightening of the normal cervical lordosis. No subluxations. Skull base and vertebrae: No acute fracture. No primary bone lesion or focal pathologic process. Soft tissues and spinal canal: No prevertebral fluid or swelling. No visible canal hematoma. Disc levels:  Multilevel degenerative changes. Upper chest: Clear lung apices. Other: None. IMPRESSION: 1. Normal head CT. 2. No cervical spine fracture or subluxation. 3. Straightening of the normal cervical lordosis. 4. Multilevel cervical spine  degenerative changes. Electronically Signed   By: Beckie Salts M.D.   On: 09/15/2016 10:45   Ct Cervical Spine Wo Contrast  Result Date: 09/15/2016 CLINICAL DATA:  Larey Seat from a two-story roof and hit the back of his head. EXAM: CT HEAD WITHOUT CONTRAST CT CERVICAL SPINE WITHOUT CONTRAST TECHNIQUE: Multidetector CT imaging of the head and cervical spine was performed following the standard protocol without intravenous contrast. Multiplanar CT image reconstructions of the cervical spine were also generated. COMPARISON:  Head CT dated 05/14/2016. FINDINGS: CT HEAD FINDINGS Brain: No evidence of acute infarction, hemorrhage, hydrocephalus, extra-axial collection or mass lesion/mass effect. Vascular: No hyperdense vessel or unexpected calcification. Skull: Normal. Negative for fracture or focal lesion. Sinuses/Orbits: No acute finding. Other: None. CT CERVICAL SPINE FINDINGS Alignment: Straightening of the normal cervical lordosis. No subluxations. Skull base and vertebrae: No acute fracture. No primary bone lesion or focal pathologic process. Soft tissues and spinal canal: No prevertebral fluid or swelling. No visible canal hematoma. Disc levels:  Multilevel degenerative changes. Upper chest: Clear lung apices. Other: None. IMPRESSION: 1. Normal head CT. 2. No cervical spine fracture or subluxation. 3. Straightening of the normal cervical lordosis. 4. Multilevel cervical spine degenerative changes. Electronically Signed   By: Beckie Salts M.D.   On: 09/15/2016 10:45  Dg Hand Complete Left  Result Date: 09/15/2016 CLINICAL DATA:  Jumped from a second story. Injury to left hand and wrist. EXAM: LEFT HAND - COMPLETE 3+ VIEW COMPARISON:  None. FINDINGS: No acute bone or soft tissue abnormalities are present in the hand. A comminuted fracture is present in the distal radius with intra-articular extension. An ulnar styloid fracture is minimally displaced. IMPRESSION: 1. Comminuted distal radial fracture with  intra-articular extension. 2. Minimally displaced ulnar styloid for tree. 3. No additional fractures in the hand. Electronically Signed   By: Marin Robertshristopher  Mattern M.D.   On: 09/15/2016 09:20    Procedures Procedures (including critical care time)  Medications Ordered in ED Medications  HYDROcodone-acetaminophen (NORCO/VICODIN) 5-325 MG per tablet 1 tablet (1 tablet Oral Given 09/16/16 2126)     Initial Impression / Assessment and Plan / ED Course  I have reviewed the triage vital signs and the nursing notes.  Pertinent labs & imaging results that were available during my care of the patient were reviewed by me and considered in my medical decision making (see chart for details).  Clinical Course     Here with pain in left wrist likely 2/2 fracture. Splint in place, hand looks ok. Treated with one pain pill. Had left AMA earlier in day from inpatient service. Did not want labs rechecked. Did not want IVF here. He is in a psychotic staate but doesn't seem to be in danger to self or others currently. HR improved with PO fluids and pain control. Is masturbating in public and not wanting to follow hospital procedure. Refusing further workup, will discharge.   Final Clinical Impressions(s) / ED Diagnoses   Final diagnoses:  Pain of left upper extremity    New Prescriptions Discharge Medication List as of 09/16/2016 10:26 PM       Marily MemosJason Yohanna Tow, MD 09/17/16 0041

## 2016-09-16 NOTE — ED Notes (Signed)
Pt was escorted off the property by security.

## 2016-09-16 NOTE — ED Notes (Signed)
Pt states he is schizoaffective and want some "sodium chloride to wash the coke out of his system".  Pt has has 8 cups of water and is currently eating a [place] sandwich.

## 2016-09-16 NOTE — ED Notes (Signed)
Declined W/C at D/C and was escorted to lobby by RN. 

## 2016-09-17 ENCOUNTER — Encounter (HOSPITAL_COMMUNITY): Payer: Self-pay | Admitting: Emergency Medicine

## 2016-09-17 ENCOUNTER — Emergency Department (HOSPITAL_COMMUNITY): Admission: EM | Admit: 2016-09-17 | Discharge: 2016-09-17 | Discharge status: Against Medical Advice | Payer: Self-pay

## 2016-09-17 ENCOUNTER — Emergency Department (HOSPITAL_COMMUNITY)
Admission: EM | Admit: 2016-09-17 | Discharge: 2016-09-17 | Disposition: A | Discharge status: Home / Self Care | Payer: Self-pay | Attending: Emergency Medicine | Admitting: Emergency Medicine

## 2016-09-17 DIAGNOSIS — M79602 Pain in left arm: Secondary | ICD-10-CM | POA: Insufficient documentation

## 2016-09-17 DIAGNOSIS — W1830XD Fall on same level, unspecified, subsequent encounter: Secondary | ICD-10-CM | POA: Insufficient documentation

## 2016-09-17 DIAGNOSIS — F17200 Nicotine dependence, unspecified, uncomplicated: Secondary | ICD-10-CM | POA: Insufficient documentation

## 2016-09-17 DIAGNOSIS — Z79899 Other long term (current) drug therapy: Secondary | ICD-10-CM | POA: Insufficient documentation

## 2016-09-17 DIAGNOSIS — S52552D Other extraarticular fracture of lower end of left radius, subsequent encounter for closed fracture with routine healing: Secondary | ICD-10-CM | POA: Insufficient documentation

## 2016-09-17 DIAGNOSIS — S52552S Other extraarticular fracture of lower end of left radius, sequela: Secondary | ICD-10-CM

## 2016-09-17 LAB — BASIC METABOLIC PANEL
ANION GAP: 9 (ref 5–15)
BUN: 12 mg/dL (ref 6–20)
CHLORIDE: 108 mmol/L (ref 101–111)
CO2: 22 mmol/L (ref 22–32)
CREATININE: 0.83 mg/dL (ref 0.61–1.24)
Calcium: 8.8 mg/dL — ABNORMAL LOW (ref 8.9–10.3)
GFR calc non Af Amer: >60 mL/min (ref >60)
Glucose, Bld: 120 mg/dL — ABNORMAL HIGH (ref 65–99)
POTASSIUM: 4 mmol/L (ref 3.5–5.1)
SODIUM: 139 mmol/L (ref 135–145)

## 2016-09-17 MED ORDER — HYDROCODONE-ACETAMINOPHEN 5-325 MG PO TABS
2.0000 | ORAL_TABLET | Freq: Once | ORAL | Status: AC
  Administered 2016-09-17: 2 via ORAL
  Filled 2016-09-17: qty 2

## 2016-09-17 MED ORDER — NAPROXEN 500 MG PO TABS: 500.0000 mg | ORAL_TABLET | Freq: Two times a day (BID) | ORAL | 0 refills | Status: AC

## 2016-09-17 NOTE — ED Triage Notes (Signed)
Per pt, states left arm pain-has been treated for symptoms yesterday-patient states he lost script

## 2016-09-17 NOTE — ED Notes (Signed)
Bed: WTR5 Expected date:  Expected time:  Means of arrival:  Comments: 

## 2016-09-17 NOTE — ED Notes (Signed)
Pt transported off of the property by [employer] for disruptive behavior. Pt seen here earlier tonight.

## 2016-09-17 NOTE — Discharge Summary (Signed)
Name: Wesley Blair MRN: [id number] DOB: [birth year] 41 y.o. PCP: No Pcp Per Patient  Date of Admission: 09/16/2016  4:17 AM Date of Discharge: 09/16/2016, patient left AMA Attending Physician: Tyson Alias, MD  Discharge Diagnosis: 1. Rhabdomyolysis 2. Crack cocaine abuse 3. Schizophrenia, NOS  Active Problems:   Crack cocaine use   Rhabdomyolysis   Discharge Medications: Allergies as of 09/16/2016   No Known Allergies     Medication List    You have not been prescribed any medications.     Disposition and follow-up:   WesleyKayd Blair left against medical advice from [facility] in [place] condition.   1.  Rhabdomyolysis - assess muscle aches, hydration, urine changes or signs of renal dysfunction  Crack cocaine use - ongoing use, assess for substance-related psychosis, polysubstance abuse  Schizophrenia - assess for symptoms, assess willingness to take medications  Homelessness - lives [address] the [address] in [place], assess social needs  2.  Labs / imaging needed at time of follow-up:  BMP, CK, UA, UDS  3.  Pending labs/ test needing follow-up: None  Follow-up Appointments: Follow-up Information    [facility]. Schedule an appointment as soon as possible for a visit.   Why:  Call office to schedule appointment to establish a primary care provider. Contact information: [address] [place] [address] [phone]          Hospital Course by problem list: Active Problems:   Crack cocaine use   Rhabdomyolysis   1. Rhabdomyolysis Mr. Battershell is an unfortunate 41 y.o gentleman with PMH polysubstance abuse, schizophrenia, homelessness brought in by police for disruptive behavior at an [facility] and found to be agitated with elevated CK of 3000, some muscle aches, no renal injury or myoglobinuria. Thought to be due to drug use vs recent fall. Admitted for IV fluids. Patient began refusing  treatments, became increasingly upset that he had a bedside sitter and had not been given a full diet. Discussed at length that he should stay and he illustrated decision making capacity. Patient left against medical advice.  2. Crack cocaine abuse - admits to crack cocaine use night before admission, found agitated and disruptive by police. UDS positive for cocaine and benzodiazepines.  3. Schizophrenia - vs substance-related psychosis, required IM Haldol and Ativan after presenting to the ED for agitation alterred and uncooperative with evaluation and testing. Behavior health consulted after admission but patient was asleep when they attempted evaluation. Patient agitated and intermittently paranoid, not actively psychotic, denying SI, HI, AVH, and demonstrating decision making capacity in repeated discussions. Left against medical advice.  Discharge Vitals:   BP (!) 143/81 (BP Location: Right Arm)   Pulse 97   Temp 98.2 F (36.8 C) (Oral)   Resp 20   Ht 6' (1.829 m)   Wt 179 lb 10.8 oz (81.5 kg)   SpO2 100%   BMI 24.37 kg/m    Pertinent Labs, Studies, and Procedures:   Results for ATTIKUS, BARTOSZEK (MRN [id number]) as of 09/17/2016 20:32  Ref. Range 09/16/2016 04:19  CK Total Latest Ref Range: 49 - 397 U/L 3,024 (H)   Results for YOJAN, PASKETT (MRN [id number]) as of 09/17/2016 20:32  Ref. Range 09/16/2016 04:19  Sodium Latest Ref Range: 135 - 145 mmol/L 136  Potassium Latest Ref Range: 3.5 - 5.1 mmol/L 3.3 (L)  Chloride Latest Ref Range: 101 - 111 mmol/L 105  CO2 Latest Ref Range: 22 - 32 mmol/L 22  Glucose Latest Ref Range: 65 - 99 mg/dL 81  BUN Latest Ref Range: 6 - 20 mg/dL 7  Creatinine Latest Ref Range: 0.61 - 1.24 mg/dL 8.460.99  Calcium Latest Ref Range: 8.9 - 10.3 mg/dL 8.7 (L)  Anion gap Latest Ref Range: 5 - 15  9    Urinalysis    Component Value Date/Time   COLORURINE STRAW (A) 09/16/2016 1055   APPEARANCEUR CLEAR 09/16/2016 1055   LABSPEC 1.006 09/16/2016 1055    PHURINE 6.0 09/16/2016 1055   GLUCOSEU NEGATIVE 09/16/2016 1055   HGBUR NEGATIVE 09/16/2016 1055   BILIRUBINUR NEGATIVE 09/16/2016 1055   KETONESUR NEGATIVE 09/16/2016 1055   PROTEINUR NEGATIVE 09/16/2016 1055   NITRITE NEGATIVE 09/16/2016 1055   LEUKOCYTESUR NEGATIVE 09/16/2016 1055   Drugs of Abuse     Component Value Date/Time   LABOPIA NONE DETECTED 09/16/2016 0441   COCAINSCRNUR POSITIVE (A) 09/16/2016 0441   LABBENZ POSITIVE (A) 09/16/2016 0441   AMPHETMU NONE DETECTED 09/16/2016 0441   THCU NONE DETECTED 09/16/2016 0441   LABBARB NONE DETECTED 09/16/2016 0441      Discharge Instructions: None given, left AMA  Signed: Althia FortsAdam Angelos Wasco, MD 09/17/2016, 8:28 PM   Pager: [phone]

## 2016-09-17 NOTE — ED Provider Notes (Signed)
[facility]-EMERGENCY DEPT Provider Note   CSN: [id number] Arrival date & time: 09/17/16  [id number]   By signing my name below, I, Teofilo PodMatthew P. Jamison, attest that this documentation has been prepared under the direction and in the presence of Langston MaskerKaren Louisiana Searles, [place] JerseyPA-C. Electronically Signed: Teofilo PodMatthew P. Jamison, ED Scribe. 09/17/2016. 11:11 AM.   History   Chief Complaint Chief Complaint  Patient presents with  . Arm Pain    The history is provided by the patient. No language interpreter was used.   HPI Comments:  Wesley Blair is a 41 y.o. male with PMHx of schizophrenia and acute kidney failure who presents to the Emergency Department complaining of constant left arm pain x 2 days. Pt reports a radius fracture 2 days ago after a fall. Pt is a cocaine user, and last used 2 days ago prior to his fall. Pt was seen here yesterday masturbating in public and not wanting to follow hospital procedure. Pt is homeless, and reports that he does not drink a lot of fluids. Pt was given a sugar tong splint after the fracture. No other alleviating factors noted. Pt denies any other associated symptoms.   Past Medical History:  Diagnosis Date  . Alcohol abuse   . Crack cocaine use   . Homelessness   . Left radial fracture   . Schizophrenia [facility](HCC)     Patient Active Problem List   Diagnosis Date Noted  . Rhabdomyolysis 09/16/2016  . Syncope 05/14/2016  . Acute kidney failure (HCC) 02/15/2016  . Dehydration 02/15/2016  . Muscle cramps 02/15/2016  . Cocaine use 02/15/2016    History reviewed. No pertinent surgical history.     Home Medications    Prior to Admission medications   Medication Sig Start Date End Date Taking? Authorizing Provider  naproxen (NAPROSYN) 500 MG tablet Take 1 tablet (500 mg total) by mouth 2 (two) times daily. 09/15/16   Vanetta MuldersScott Zackowski, MD    Family History No family history on file.  Social History Social History  Substance Use Topics  . Smoking status: Current Every Day  Smoker  . Smokeless tobacco: Never Used  . Alcohol use Yes     Comment: Reports 2 beers daily     Allergies   Patient has no known allergies.   Review of Systems Review of Systems  Constitutional: Negative for fever.  Musculoskeletal: Positive for arthralgias and myalgias.  All other systems reviewed and are negative.    Physical Exam Updated Vital Signs BP 141/89 (BP Location: Right Arm)   Pulse 78   Temp 98.4 F (36.9 C) (Oral)   Resp 16   SpO2 100%   Physical Exam  Constitutional: He appears well-developed and well-nourished. No distress.  HENT:  Head: Normocephalic and atraumatic.  Eyes: Conjunctivae are normal.  Cardiovascular: Normal rate.   Pulmonary/Chest: Effort normal.  Abdominal: He exhibits no distension.  Neurological: He is alert.  Skin: Skin is warm and dry.  Psychiatric: He has a normal mood and affect.  Nursing note and vitals reviewed.    ED Treatments / Results  DIAGNOSTIC STUDIES:  Oxygen Saturation is 100% on RA, normal by my interpretation.    COORDINATION OF CARE:  11:11 AM Discussed treatment plan with pt at bedside and pt agreed to plan.   Labs (all labs ordered are listed, but only abnormal results are displayed) Labs Reviewed  BASIC METABOLIC PANEL    EKG  EKG Interpretation None       Radiology No results found.  Procedures  Procedures (including critical care time)  Medications Ordered in ED Medications  HYDROcodone-acetaminophen (NORCO/VICODIN) 5-325 MG per tablet 2 tablet (not administered)     Initial Impression / Assessment and Plan / ED Course  Pt given 3 4oz cans of apple juice, and 2 12oz cups of water for oral hydration.   I have reviewed the triage vital signs and the nursing notes.  Pertinent labs & imaging results that were available during my care of the patient were reviewed by me and considered in my medical decision making (see chart for details).  Clinical Course   Pt given hydrocodone.   BMet show normal renal function.   Pt advied he needs to see Orthopaedist for evaluation,  Ice,  Rx for pain   Final Clinical Impressions(s) / ED Diagnoses   Final diagnoses:  Left arm pain  Closed extraarticular fracture of distal radius, left, sequela    New Prescriptions Discharge Medication List as of 09/17/2016 12:43 PM     Meds ordered this encounter  Medications  . HYDROcodone-acetaminophen (NORCO/VICODIN) 5-325 MG per tablet 2 tablet  . naproxen (NAPROSYN) 500 MG tablet    Sig: Take 1 tablet (500 mg total) by mouth 2 (two) times daily.    Dispense:  14 tablet    Refill:  0    Order Specific Question:   Supervising Provider    Answer:   Eber Hong [[id number]]  An After Visit Summary was printed and given to the patient.  I personally performed the services in this documentation, which was scribed in my presence.  The recorded information has been reviewed and considered.   Barnet Pall.   Lonia Skinner Wittenberg, PA-C 09/17/16 1924    Azalia Bilis, MD 09/18/16 [phone]

## 2016-09-17 NOTE — Discharge Instructions (Signed)
Follow up with the Orthopaedist as scheduled  °

## 2017-08-15 IMAGING — CT CT HEAD W/O CM
2 of 8 series · 8 of 47 positions shown, 10 images · non-contrast
Comparison: Head CT dated 05/14/2016.

CLINICAL DATA: Fell from a two-story roof and hit the back of his
head.

EXAM:
CT HEAD WITHOUT CONTRAST
CT CERVICAL SPINE WITHOUT CONTRAST
TECHNIQUE: Multidetector CT imaging of the head and cervical spine was
performed following the standard protocol without intravenous
contrast. Multiplanar CT image reconstructions of the cervical spine
were also generated.

[Series 307: coronal · coronal · 0.32mm/px · 1 of 65 slices shown]
[im 52/65  brain]
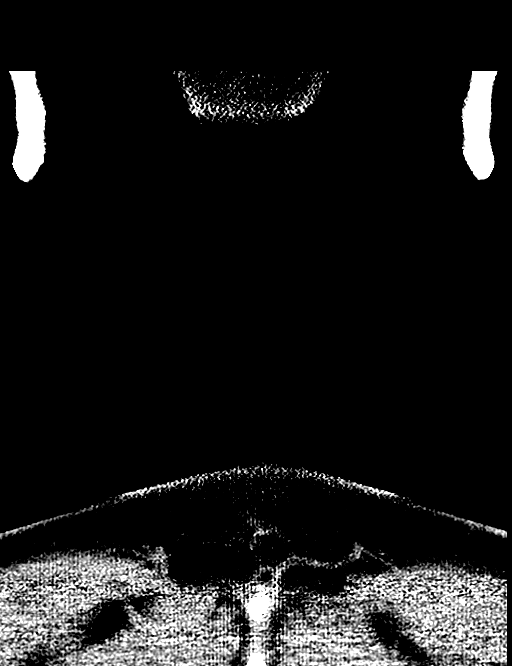

[Series 309: orthoganals · axial · 0.31mm/px · z∈[-102,+23]mm · 7 of 90 slices shown, 9 images]
[im 12/90  brain]
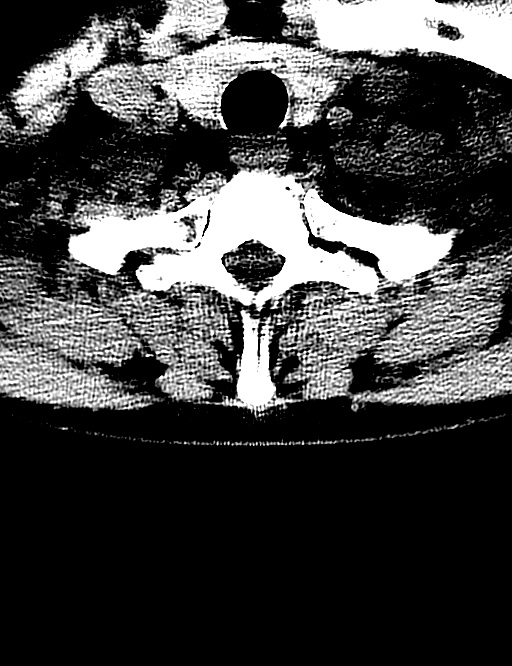
[im 12/90  bone]
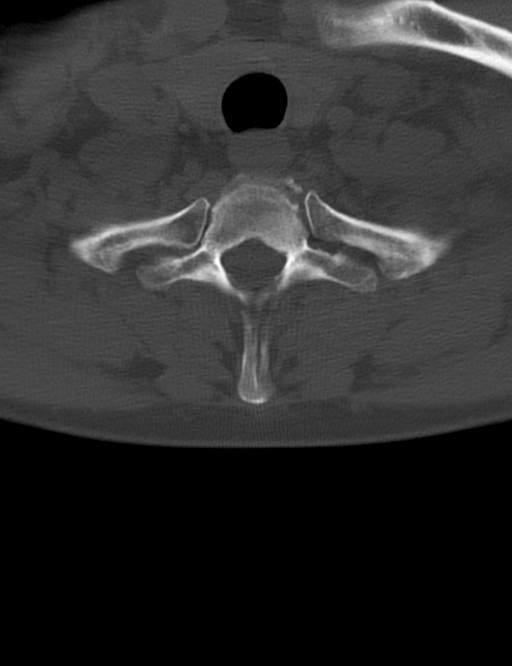
[im 23/90  brain]
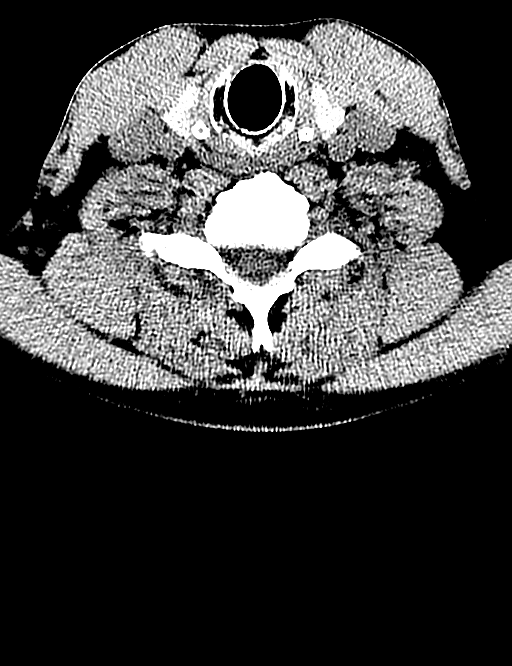
[im 34/90  brain]
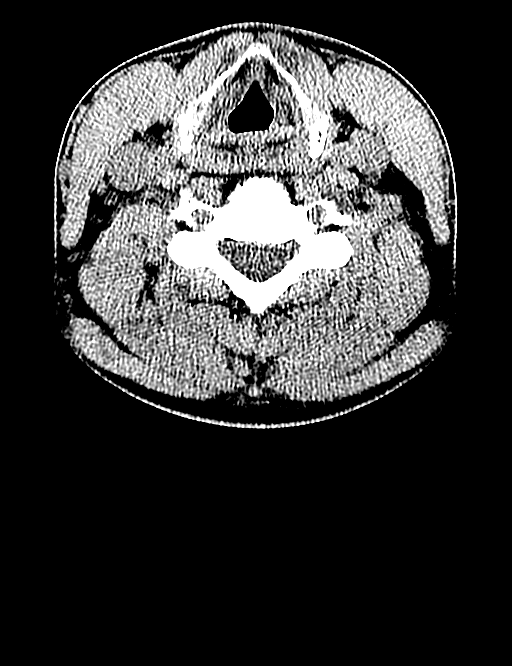
[im 45/90  brain]
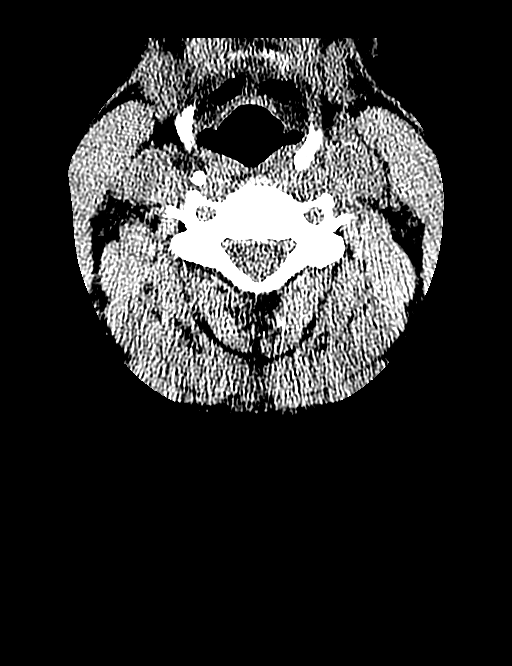
[im 56/90  brain]
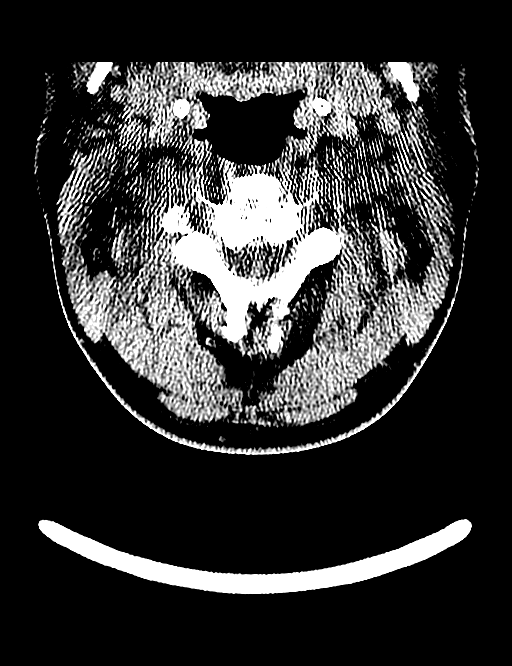
[im 56/90  bone]
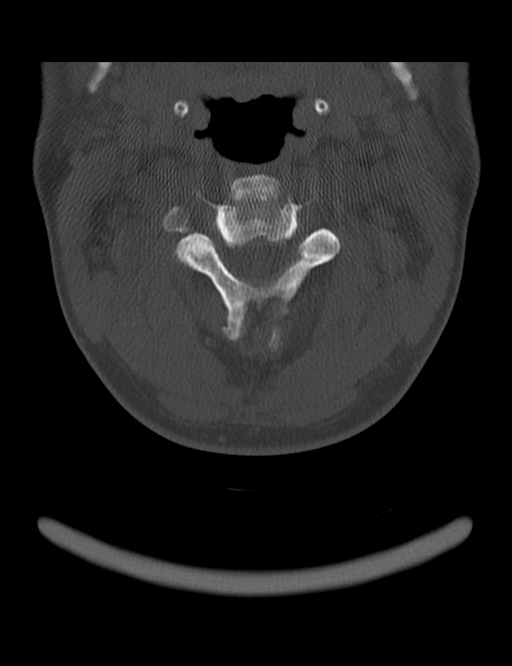
[im 67/90  brain]
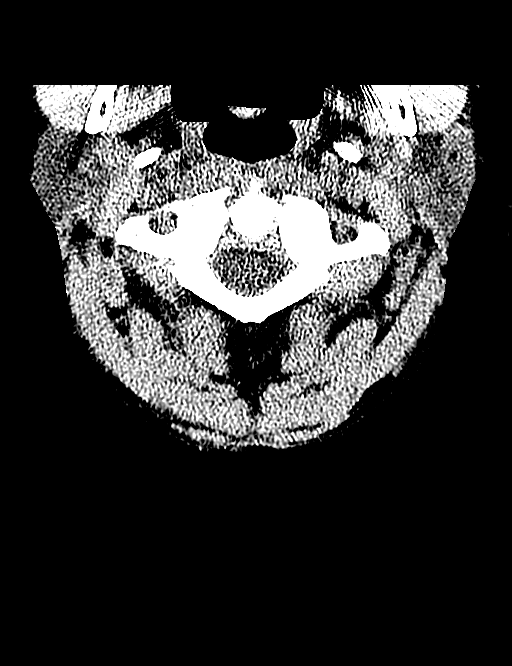
[im 78/90  brain]
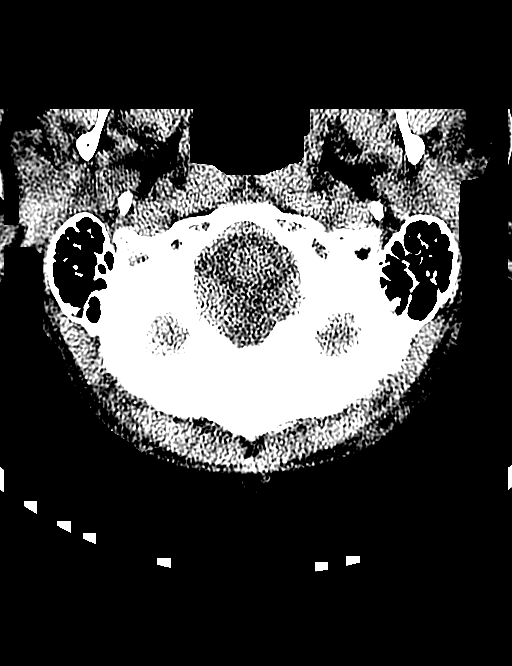

[8 of 47 positions shown; findings below may reference images not displayed]

FINDINGS: CT HEAD FINDINGS

Brain: No evidence of acute infarction, hemorrhage, hydrocephalus,
extra-axial collection or mass lesion/mass effect.

Vascular: No hyperdense vessel or unexpected calcification.

Skull: Normal. Negative for fracture or focal lesion.

Sinuses/Orbits: No acute finding.

Other: None.

CT CERVICAL SPINE FINDINGS

Alignment: Straightening of the normal cervical lordosis. No
subluxations.

Skull base and vertebrae: No acute fracture. No primary bone lesion
or focal pathologic process.

Soft tissues and spinal canal: No prevertebral fluid or swelling. No
visible canal hematoma.

Disc levels:  Multilevel degenerative changes.

Upper chest: Clear lung apices.

Other: None.
IMPRESSION: 1. Normal head CT.
2. No cervical spine fracture or subluxation.
3. Straightening of the normal cervical lordosis.
4. Multilevel cervical spine degenerative changes.

## 2017-08-15 IMAGING — CR DG CHEST 2V
2 series · 2 of 2 positions shown · non-contrast
Comparison: Thoracic spine radiographs dated 05/12/2016.

CLINICAL DATA: Fell off the roof of a two-story house.  Smoker.

EXAM:
CHEST  2 VIEW

[chest pa]
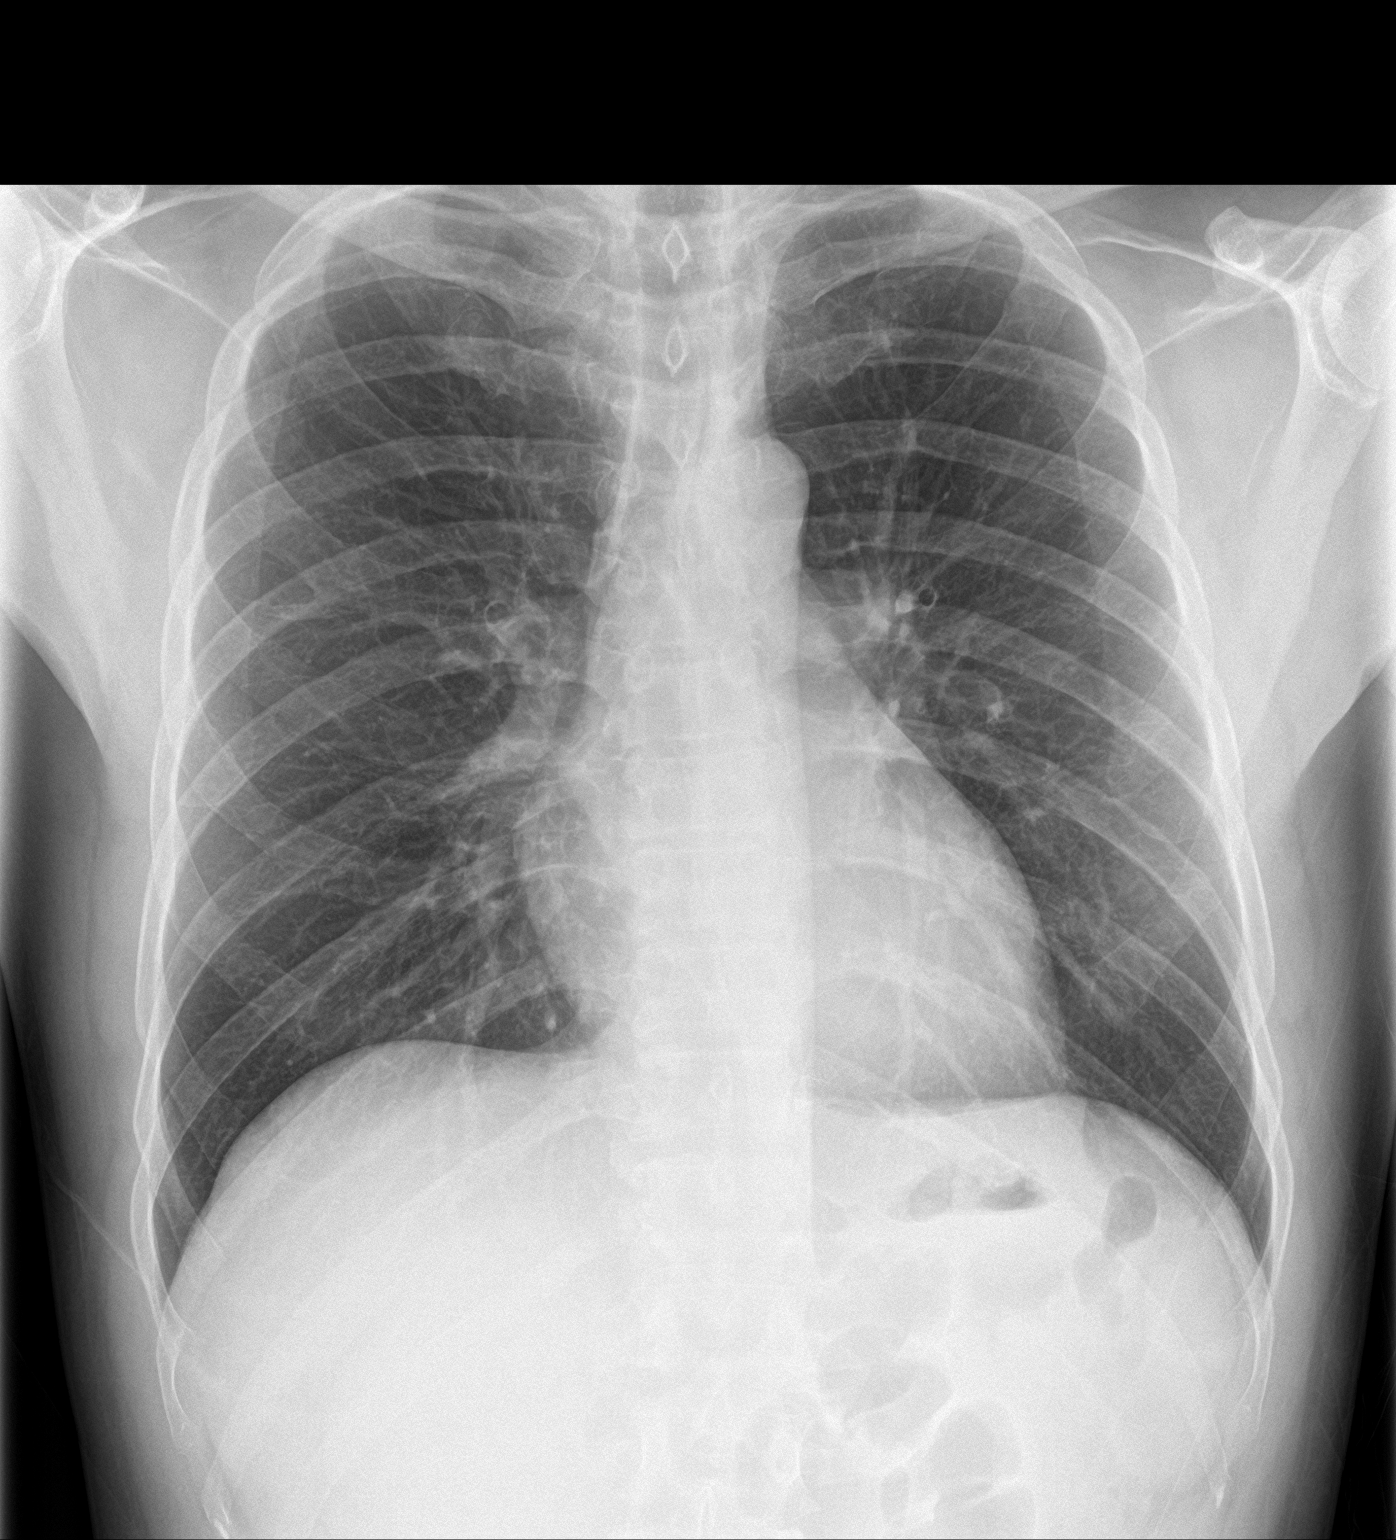

[chest lat]
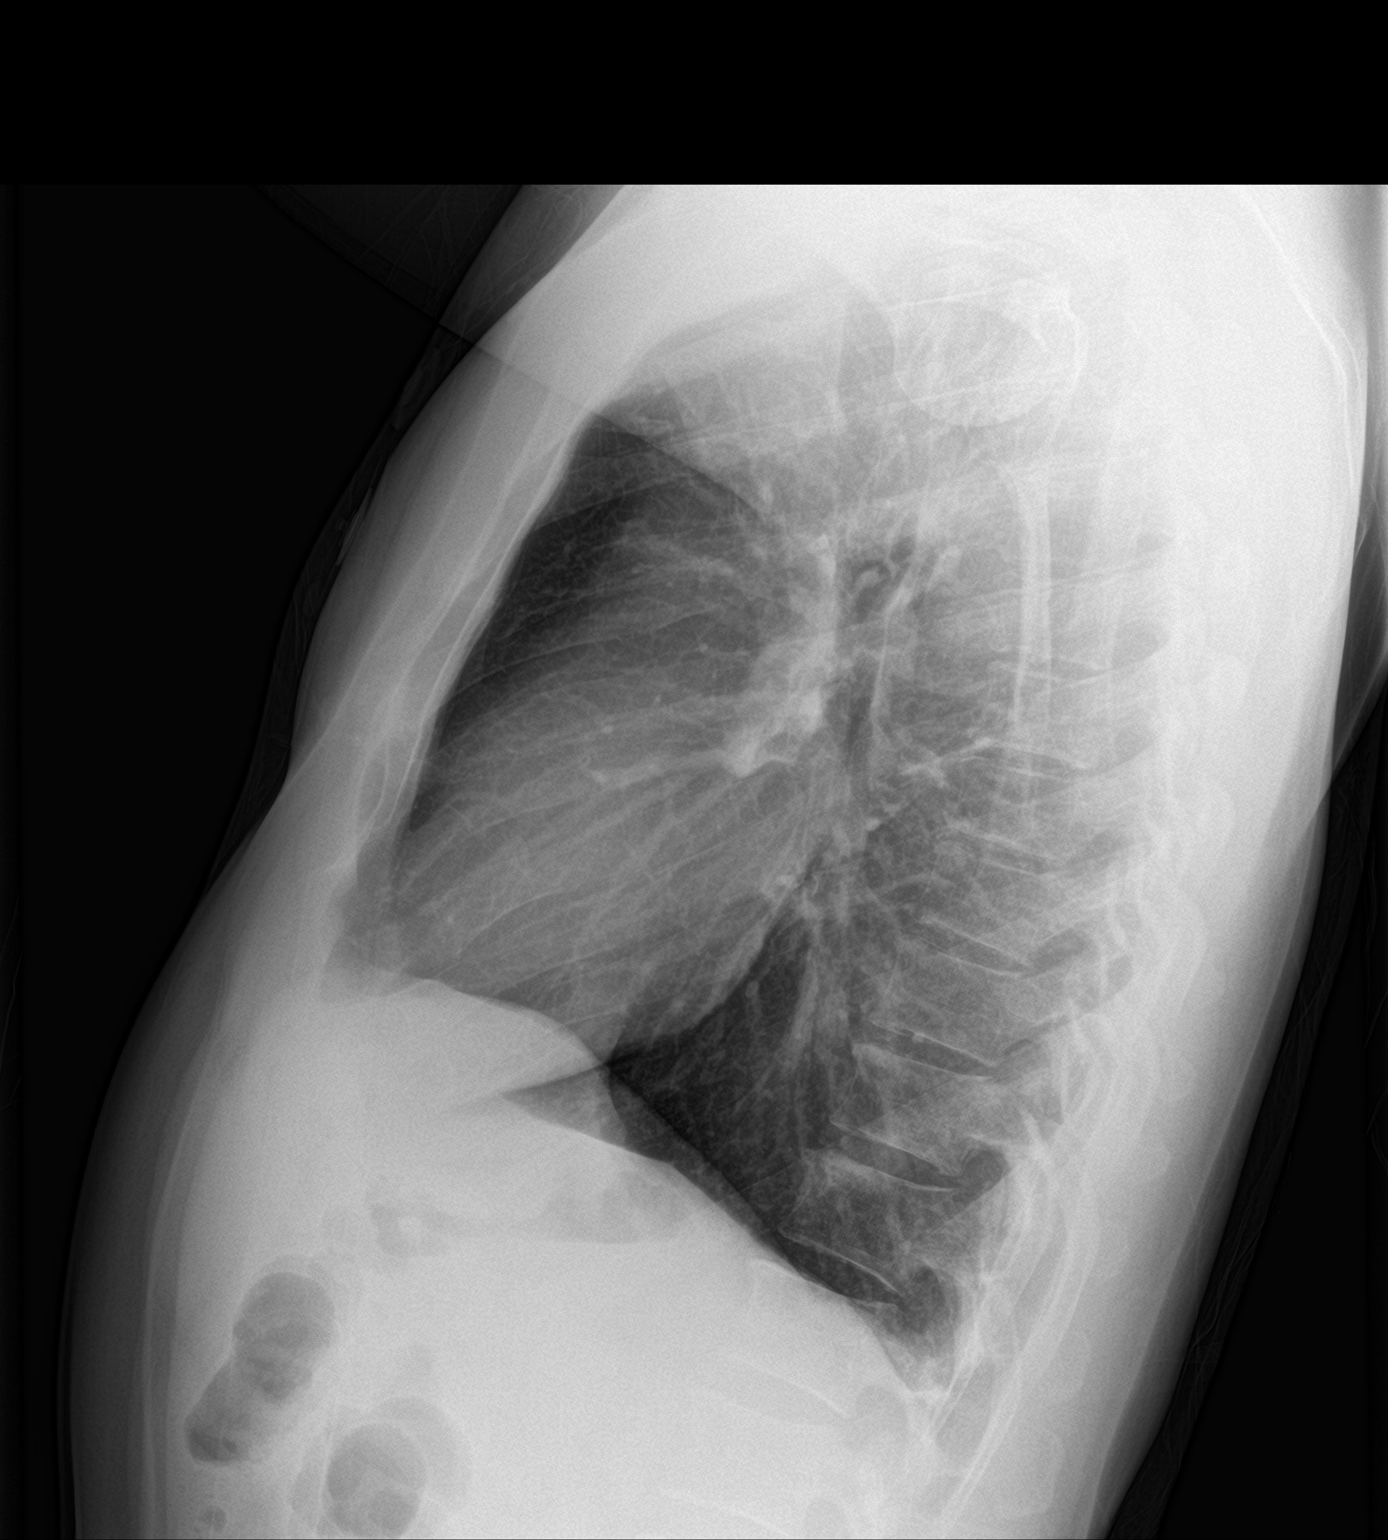

[2 of 2 positions shown; findings below may reference images not displayed]

FINDINGS: Normal sized heart. Clear lungs. Central peribronchial thickening.
Mild flattening of the hemidiaphragms. No fracture pneumothorax
seen.
IMPRESSION: Mild changes of COPD and chronic bronchitis.  No acute abnormality.
